# Patient Record
Sex: Female | Born: 1970 | Race: White | Hispanic: No | Marital: Married | Smoking: Former smoker
Health system: Southern US, Community
[De-identification: ages and names within clinical notes are randomized; demographics above are authoritative.]

## PROBLEM LIST (undated history)

## (undated) DIAGNOSIS — N6002 Solitary cyst of left breast: Secondary | ICD-10-CM

## (undated) DIAGNOSIS — F329 Major depressive disorder, single episode, unspecified: Secondary | ICD-10-CM

## (undated) DIAGNOSIS — D497 Neoplasm of unspecified behavior of endocrine glands and other parts of nervous system: Secondary | ICD-10-CM

## (undated) DIAGNOSIS — G4730 Sleep apnea, unspecified: Secondary | ICD-10-CM

## (undated) DIAGNOSIS — R51 Headache: Secondary | ICD-10-CM

## (undated) DIAGNOSIS — R519 Headache, unspecified: Secondary | ICD-10-CM

## (undated) DIAGNOSIS — E8881 Metabolic syndrome: Secondary | ICD-10-CM

## (undated) DIAGNOSIS — R32 Unspecified urinary incontinence: Secondary | ICD-10-CM

## (undated) DIAGNOSIS — E282 Polycystic ovarian syndrome: Secondary | ICD-10-CM

## (undated) DIAGNOSIS — E88819 Insulin resistance, unspecified: Secondary | ICD-10-CM

## (undated) DIAGNOSIS — R42 Dizziness and giddiness: Secondary | ICD-10-CM

## (undated) DIAGNOSIS — N809 Endometriosis, unspecified: Secondary | ICD-10-CM

## (undated) DIAGNOSIS — F32A Depression, unspecified: Secondary | ICD-10-CM

## (undated) DIAGNOSIS — F419 Anxiety disorder, unspecified: Secondary | ICD-10-CM

## (undated) DIAGNOSIS — Z9889 Other specified postprocedural states: Secondary | ICD-10-CM

## (undated) DIAGNOSIS — D259 Leiomyoma of uterus, unspecified: Secondary | ICD-10-CM

## (undated) DIAGNOSIS — R112 Nausea with vomiting, unspecified: Secondary | ICD-10-CM

## (undated) DIAGNOSIS — G629 Polyneuropathy, unspecified: Secondary | ICD-10-CM

## (undated) DIAGNOSIS — E785 Hyperlipidemia, unspecified: Secondary | ICD-10-CM

## (undated) DIAGNOSIS — T8859XA Other complications of anesthesia, initial encounter: Secondary | ICD-10-CM

## (undated) DIAGNOSIS — T4145XA Adverse effect of unspecified anesthetic, initial encounter: Secondary | ICD-10-CM

## (undated) DIAGNOSIS — D649 Anemia, unspecified: Secondary | ICD-10-CM

## (undated) DIAGNOSIS — M1990 Unspecified osteoarthritis, unspecified site: Secondary | ICD-10-CM

## (undated) DIAGNOSIS — T7840XA Allergy, unspecified, initial encounter: Secondary | ICD-10-CM

## (undated) DIAGNOSIS — K219 Gastro-esophageal reflux disease without esophagitis: Secondary | ICD-10-CM

## (undated) HISTORY — DX: Headache, unspecified: R51.9

## (undated) HISTORY — DX: Insulin resistance, unspecified: E88.819

## (undated) HISTORY — DX: Headache: R51

## (undated) HISTORY — DX: Neoplasm of unspecified behavior of endocrine glands and other parts of nervous system: D49.7

## (undated) HISTORY — DX: Leiomyoma of uterus, unspecified: D25.9

## (undated) HISTORY — DX: Depression, unspecified: F32.A

## (undated) HISTORY — DX: Major depressive disorder, single episode, unspecified: F32.9

## (undated) HISTORY — DX: Unspecified urinary incontinence: R32

## (undated) HISTORY — DX: Unspecified osteoarthritis, unspecified site: M19.90

## (undated) HISTORY — PX: OTHER SURGICAL HISTORY: SHX169

## (undated) HISTORY — DX: Hyperlipidemia, unspecified: E78.5

## (undated) HISTORY — DX: Allergy, unspecified, initial encounter: T78.40XA

## (undated) HISTORY — PX: PITUITARY EXCISION: SHX745

## (undated) HISTORY — DX: Polycystic ovarian syndrome: E28.2

## (undated) HISTORY — DX: Gastro-esophageal reflux disease without esophagitis: K21.9

## (undated) HISTORY — DX: Endometriosis, unspecified: N80.9

## (undated) HISTORY — DX: Metabolic syndrome: E88.81

---

## 1995-05-11 HISTORY — PX: TRANSPHENOIDAL / TRANSNASAL HYPOPHYSECTOMY / RESECTION PITUITARY TUMOR: SUR1382

## 1998-10-02 ENCOUNTER — Encounter (INDEPENDENT_AMBULATORY_CARE_PROVIDER_SITE_OTHER): Payer: Self-pay | Admitting: Specialist

## 1998-10-02 ENCOUNTER — Other Ambulatory Visit: Admission: RE | Admit: 1998-10-02 | Discharge: 1998-10-02 | Payer: Self-pay | Admitting: Gastroenterology

## 1999-03-27 ENCOUNTER — Other Ambulatory Visit: Admission: RE | Admit: 1999-03-27 | Discharge: 1999-03-27 | Payer: Self-pay | Admitting: Obstetrics and Gynecology

## 2000-02-17 ENCOUNTER — Other Ambulatory Visit: Admission: RE | Admit: 2000-02-17 | Discharge: 2000-02-17 | Payer: Self-pay | Admitting: Obstetrics and Gynecology

## 2000-03-12 HISTORY — PX: ANKLE RECONSTRUCTION: SHX1151

## 2000-06-02 ENCOUNTER — Ambulatory Visit (HOSPITAL_COMMUNITY): Admission: RE | Admit: 2000-06-02 | Discharge: 2000-06-02 | Payer: Self-pay | Admitting: Obstetrics and Gynecology

## 2000-06-02 ENCOUNTER — Encounter (INDEPENDENT_AMBULATORY_CARE_PROVIDER_SITE_OTHER): Payer: Self-pay

## 2004-02-13 ENCOUNTER — Inpatient Hospital Stay (HOSPITAL_COMMUNITY): Admission: AD | Admit: 2004-02-13 | Discharge: 2004-02-13 | Payer: Self-pay | Admitting: Gynecology

## 2004-02-29 ENCOUNTER — Ambulatory Visit (HOSPITAL_COMMUNITY): Admission: RE | Admit: 2004-02-29 | Discharge: 2004-02-29 | Payer: Self-pay | Admitting: Gynecology

## 2004-04-24 ENCOUNTER — Ambulatory Visit (HOSPITAL_COMMUNITY): Admission: RE | Admit: 2004-04-24 | Discharge: 2004-04-24 | Payer: Self-pay | Admitting: Gynecology

## 2004-06-06 ENCOUNTER — Ambulatory Visit (HOSPITAL_COMMUNITY): Admission: RE | Admit: 2004-06-06 | Discharge: 2004-06-06 | Payer: Self-pay | Admitting: Gynecology

## 2004-08-11 ENCOUNTER — Ambulatory Visit (HOSPITAL_COMMUNITY): Admission: AD | Admit: 2004-08-11 | Discharge: 2004-08-11 | Payer: Self-pay | Admitting: Gynecology

## 2004-08-12 ENCOUNTER — Inpatient Hospital Stay (HOSPITAL_COMMUNITY): Admission: AD | Admit: 2004-08-12 | Discharge: 2004-08-12 | Payer: Self-pay | Admitting: Gynecology

## 2004-08-24 ENCOUNTER — Inpatient Hospital Stay (HOSPITAL_COMMUNITY): Admission: AD | Admit: 2004-08-24 | Discharge: 2004-08-26 | Payer: Self-pay | Admitting: Gynecology

## 2004-08-27 ENCOUNTER — Encounter: Admission: RE | Admit: 2004-08-27 | Discharge: 2004-09-24 | Payer: Self-pay | Admitting: Gynecology

## 2005-07-10 HISTORY — PX: DILATION AND CURETTAGE OF UTERUS: SHX78

## 2005-08-30 ENCOUNTER — Emergency Department (HOSPITAL_COMMUNITY): Admission: EM | Admit: 2005-08-30 | Discharge: 2005-08-30 | Payer: Self-pay | Admitting: Emergency Medicine

## 2005-09-11 ENCOUNTER — Ambulatory Visit: Payer: Self-pay | Admitting: Gynecology

## 2005-09-25 ENCOUNTER — Ambulatory Visit (HOSPITAL_COMMUNITY): Admission: RE | Admit: 2005-09-25 | Discharge: 2005-09-25 | Payer: Self-pay | Admitting: Gynecology

## 2005-09-28 ENCOUNTER — Ambulatory Visit: Payer: Self-pay | Admitting: Gynecology

## 2005-10-16 ENCOUNTER — Emergency Department: Payer: Self-pay | Admitting: Emergency Medicine

## 2005-10-23 ENCOUNTER — Inpatient Hospital Stay (HOSPITAL_COMMUNITY): Admission: AD | Admit: 2005-10-23 | Discharge: 2005-10-23 | Payer: Self-pay | Admitting: Obstetrics & Gynecology

## 2005-10-26 ENCOUNTER — Ambulatory Visit: Payer: Self-pay | Admitting: Gynecology

## 2005-10-27 ENCOUNTER — Ambulatory Visit (HOSPITAL_COMMUNITY): Admission: AD | Admit: 2005-10-27 | Discharge: 2005-10-28 | Payer: Self-pay | Admitting: Obstetrics and Gynecology

## 2005-10-27 ENCOUNTER — Encounter (INDEPENDENT_AMBULATORY_CARE_PROVIDER_SITE_OTHER): Payer: Self-pay | Admitting: *Deleted

## 2005-10-27 ENCOUNTER — Ambulatory Visit: Payer: Self-pay | Admitting: Obstetrics and Gynecology

## 2005-11-16 ENCOUNTER — Ambulatory Visit: Payer: Self-pay | Admitting: Gynecology

## 2007-01-09 IMAGING — US US OB COMP +14 WK
1 series · 13 of 28 positions shown · non-contrast
Comparison: none

CLINICAL DATA: Anatomic exam.  History of prior from incompetent cervix at 18 weeks.  Cerclage in place.

[Series 1: us ob comp +14 wk · 0.33mm/px · 13 of 85 slices shown]
[im 4/85]
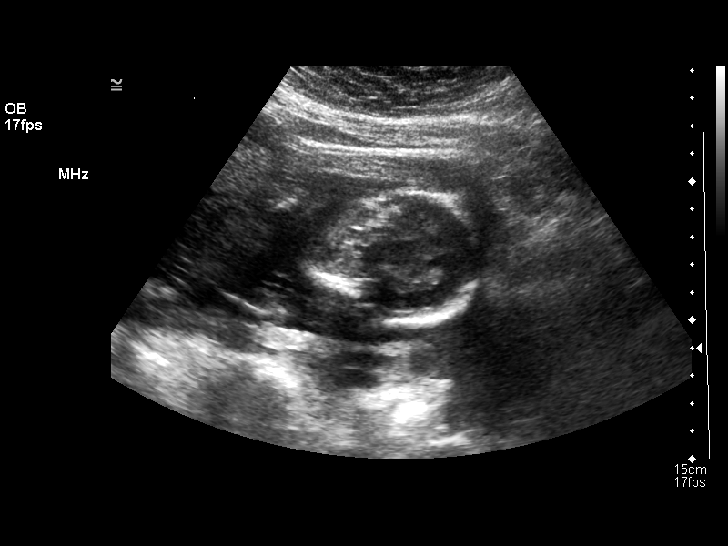
[im 10/85]
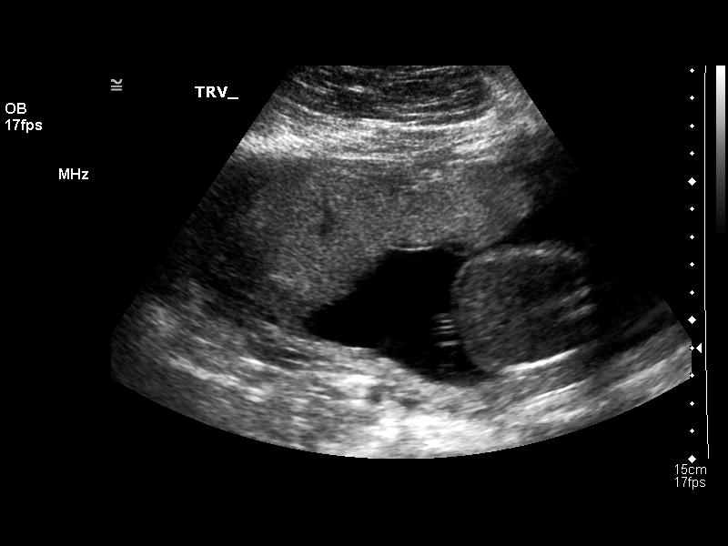
[im 16/85]
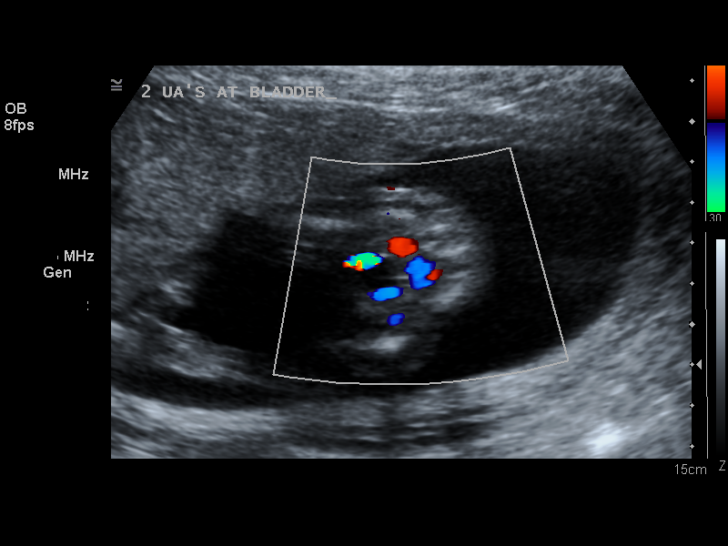
[im 22/85]
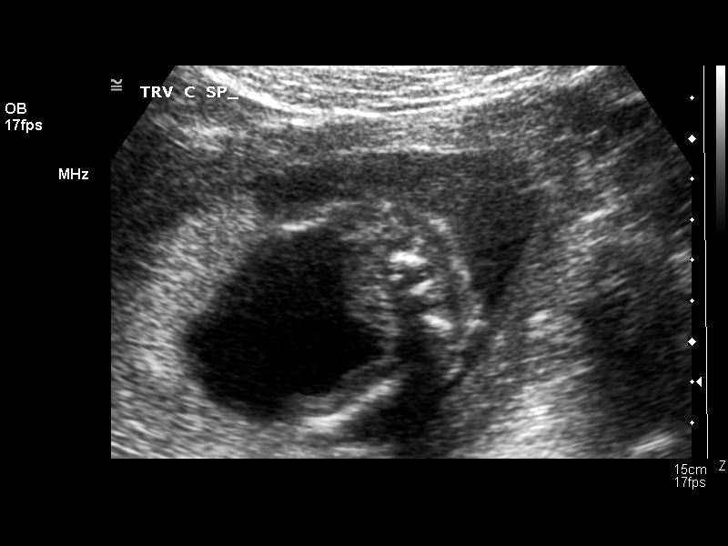
[im 29/85]
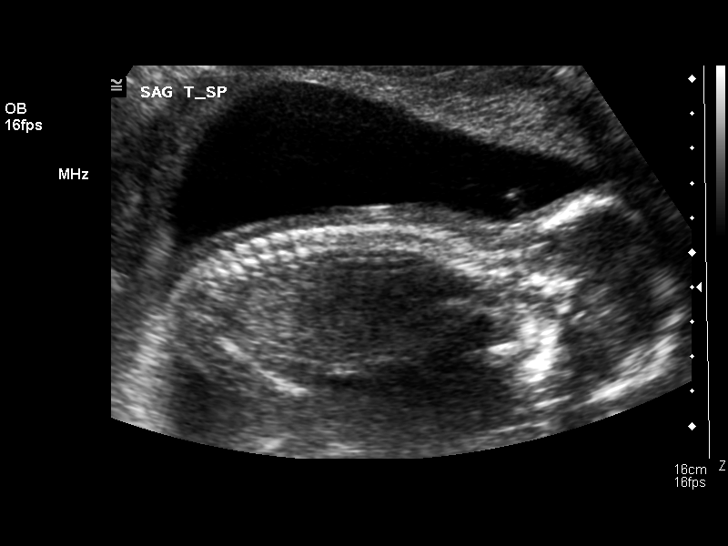
[im 35/85]
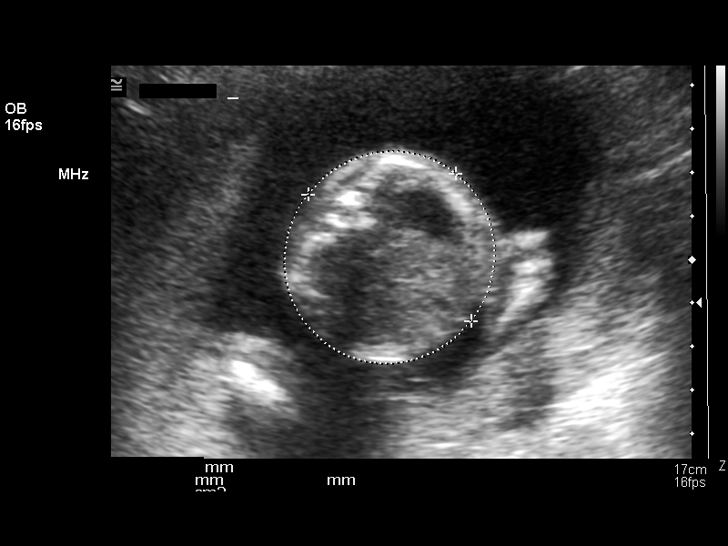
[im 44/85]
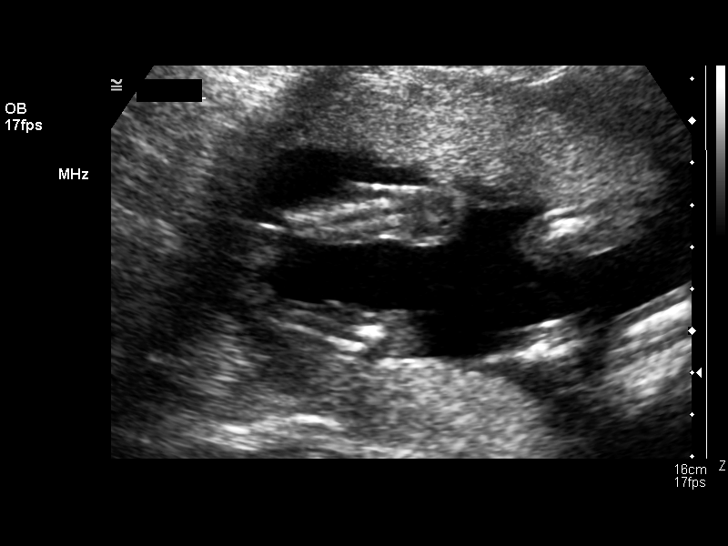
[im 50/85]
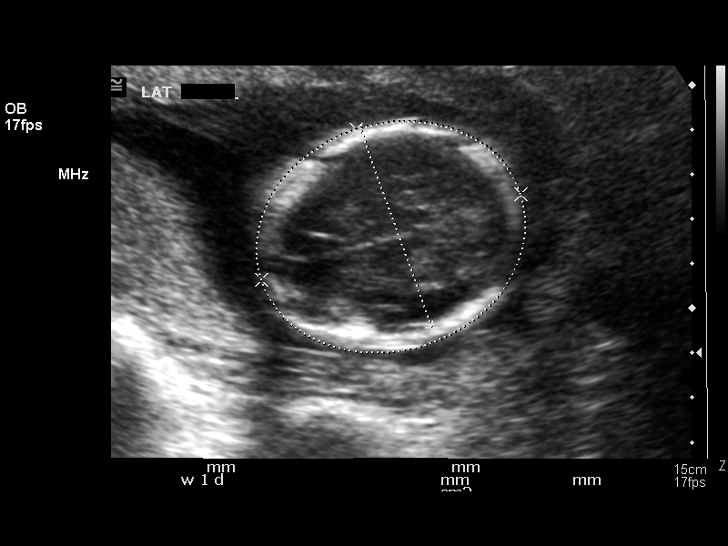
[im 57/85]
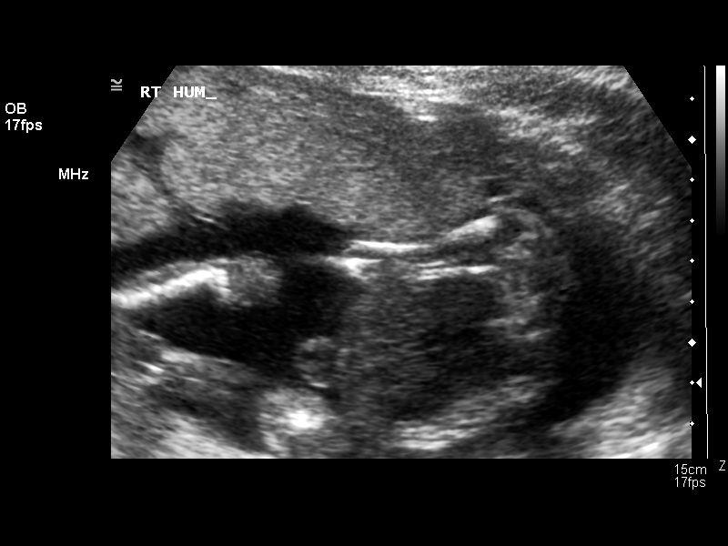
[im 63/85]
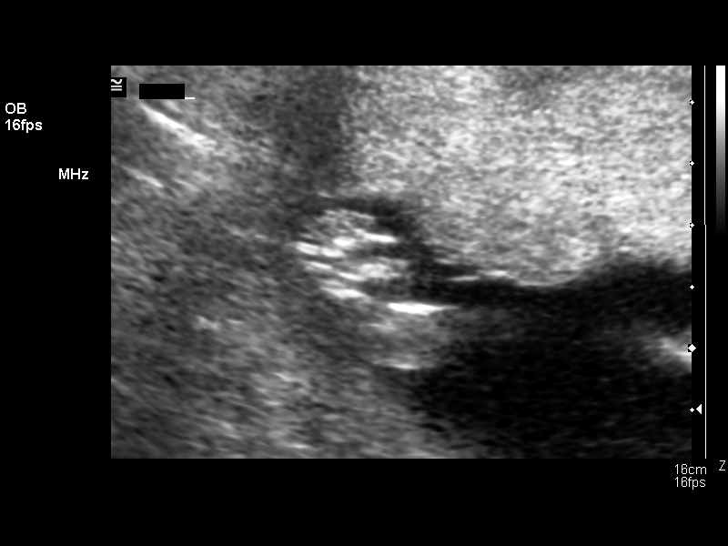
[im 69/85]
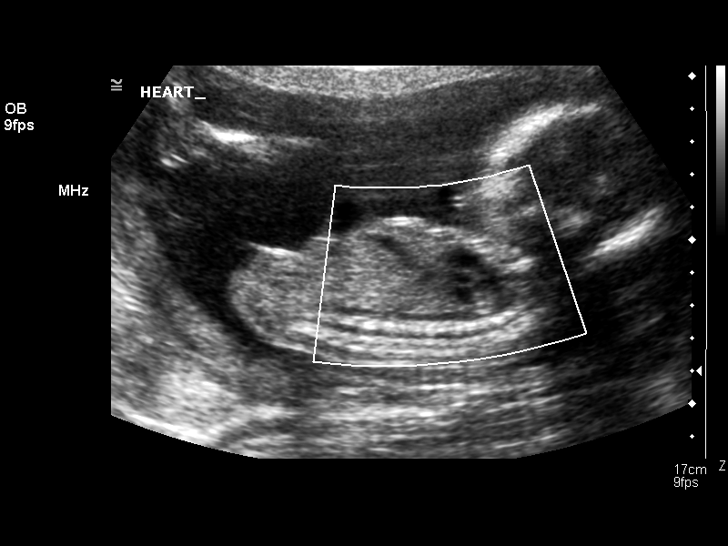
[im 75/85]
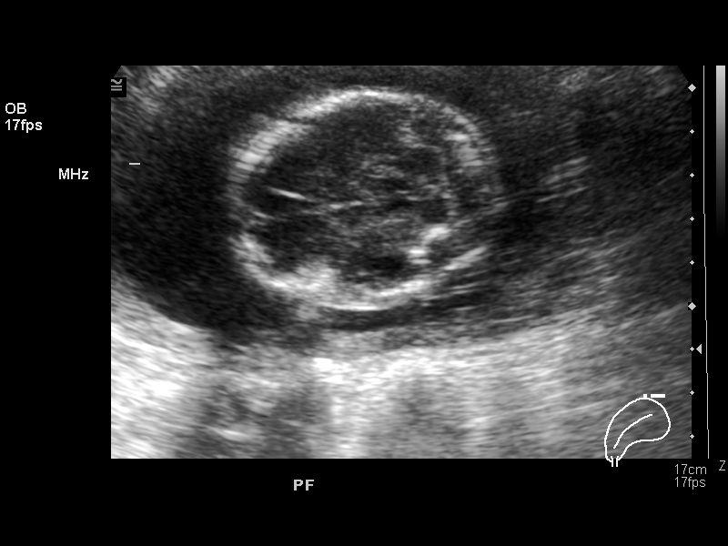
[im 81/85]
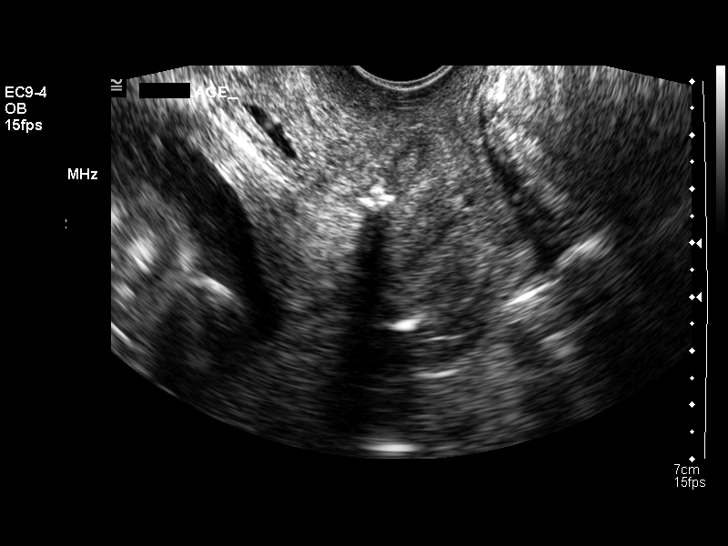

[13 of 28 positions shown; findings below may reference images not displayed]

OBSTETRICAL ULTRASOUND:
Overall scan resolution was diminished by maternal body habitus.  
Number of Fetuses:  1
Heart Rate:  157
Movement:  Yes
Breathing:  No  
Presentation:  Cephalic
Placental Location:  Anterior
Grade:  I
Previa:  No
Amniotic Fluid (Subjective):  Normal
Amniotic Fluid (Objective):   4.9 cm Vertical pocket

FETAL BIOMETRY
BPD:  4.7 cm   20 w 1 d
HC:  17.5 cm  20 w 0 d
AC:  15.0 cm   20 w 2 d
FL:    3.3 cm   20 w 2 d

MEAN GA:  20 w 1 d

FETAL ANATOMY
Lateral Ventricles:    Visualized 
Thalami/CSP:      Visualized 
Posterior Fossa:  Visualized 
Nuchal Region:    Visualized 
Spine:      Visualized 
4 Chamber Heart on Left:      Visualized 
Stomach on Left:      Visualized 
3 Vessel Cord:    Visualized 
Cord Insertion site:    Visualized 
Kidneys:  Visualized 
Bladder:  Visualized 
Extremities:      Visualized 

ADDITIONAL ANATOMY VISUALIZED:  LVOT, RVOT, upper lip, orbits, profile, diaphragm, heel, 5th digit and female genitalia.

MATERNAL UTERINE AND ADNEXAL FINDINGS
Cervix:   4.2 cm Transvaginally
IMPRESSION: 1.  Single intrauterine pregnancy demonstrating an estimated gestational age by ultrasound of 20 weeks and 1 day.  Correlation with assigned gestational age by initial ultrasound of 19 weeks and 2 days suggests appropriate growth.  
2.  No focal fetal or placental abnormalities are noted.  All of the fetal anatomy could be seen with the exception of the arches which could not be seen due to positioning.  
3.  No cervical length.  The patient?s cerclage was visualized in place.

</u12:p>

## 2007-07-20 ENCOUNTER — Ambulatory Visit: Payer: Self-pay | Admitting: Otolaryngology

## 2007-07-26 ENCOUNTER — Ambulatory Visit: Payer: Self-pay | Admitting: Family Medicine

## 2007-11-02 ENCOUNTER — Ambulatory Visit: Payer: Self-pay | Admitting: Internal Medicine

## 2008-05-31 ENCOUNTER — Ambulatory Visit: Payer: Self-pay | Admitting: Internal Medicine

## 2010-03-07 ENCOUNTER — Observation Stay: Payer: Self-pay | Admitting: Specialist

## 2010-06-27 NOTE — H&P (Signed)
[facility]  Patient:    Paula Oliver, Paula Oliver                       MRN: [id number] Adm. Date:  [id number] Attending:  Lenoard Aden CC:         [facility]   History and Physical  CHIEF COMPLAINT:              Dyspareunia, dysmenorrhea, and dysfunctional uterine bleeding.  HISTORY OF PRESENT ILLNESS:   This is a 40 year old white female, gravida 0, para 0, with known irregular uterine bleeding, and workup to include questionable structural lesion on saline sonohistography, and persistent dyspareunia, and dysmenorrhea.  Workup is normal to include an otherwise normal ultrasound, and a normal laboratory workup to include normal TSH and prolactin.  PAST MEDICAL HISTORY:         Remarkable for no medical or surgical hospitalizations except for removal of a benign pituitary tumor in 1999.  ALLERGIES:                    NO KNOWN DRUG ALLERGIES.  MEDICATIONS:                  Takes no medications.  FAMILY HISTORY:               Diabetes and hypertension.  SOCIAL HISTORY:               A smoker of a third of a pack a day.  Denies domestic or physical violence.  PREGNANCY HISTORY:            Noncontributory.  PHYSICAL EXAMINATION:  GENERAL:                      An obese white female in no apparent distress.  HEENT:                        Normal.  LUNGS:                        Clear.  HEART:                        Regular rhythm.  ABDOMEN:                      Soft, obese, and nontender.  PELVIC EXAMINATION:           Reveals a retroflexed uterus.  No adnexal masses.  Diffuse adnexal tenderness, specifically on the right which is greater than the left.  No palpable masses as noted.  EXTREMITIES:                  Revealed no cords.  NEUROLOGIC EXAMINATION:       Nonfocal.  IMPRESSION:                   1. Dysfunctional uterine bleeding with                                  questionable structural lesion.                               2.  Worsening secondary dyspareunia and  dysmenorrhea.  PLAN:                         Proceed with diagnostic laparoscopy, diagnostic hysteroscopy, resectoscope, and D&C.  Risks of anesthesia, infection, bleeding, injury to abdominal organs, and need for repair is discussed. Uterine perforation, delayed versus immediate complications to include bowel and bladder injury were also discussed.  The patient acknowledges and desires to proceed.  She is apprised of the fact that the inability to cure pain as a result of surgery is a possibility.  She will proceed with diagnostic laparoscopy and hysteroscopy as noted. DD:  06/02/00 TD:  06/02/00 Job: [id number]

## 2010-06-27 NOTE — Op Note (Signed)
[facility]  Patient:    Paula Oliver, Paula Oliver                       MRN: [id number] Proc. Date: 06/02/00 Adm. Date:  [id number] Attending:  Lenoard Aden CC:         [facility]   Operative Report  PREOPERATIVE DIAGNOSES:       1. Dysfunctional uterine bleeding with                                  structural lesion on saline                                  sonohysterography.                               2. Secondary dysmenorrhea.                               3. Secondary dyspareunia.  POSTOPERATIVE DIAGNOSES:      1. Endometrial polyp.                               2. Pelvic endometriosis.  PROCEDURES:                   1. Diagnostic hysteroscopy.                               2. Resectoscopic polypectomy.                               3. Dilation and curettage.                               4. Diagnostic laparoscopy.                               5. Left ovarian cystectomy.                               6. Fulguration of left and right ovarian                                  endometriosis.                               7. Fulguration of cul-de-sac endometriosis.  SURGEON:                      Lenoard Aden, M.D.  ANESTHESIA:                   General.  ESTIMATED BLOOD LOSS:         Less than 50 cc.  FLUID DEFICIT:  30 cc.  COUNTS:                       Correct.  DISPOSITION:                  Patient to recovery in good condition.  DESCRIPTION OF PROCEDURE:     After being apprised of the risks of anesthesia, infection, bleeding, injury to abdominal organs with the need for repair, and the inability to cure pain, the patient was brought to the operating room, where she was administered general anesthesia without complications, prepped and draped in the usual sterile fashion.  The feet were placed in the elephant stirrups.  Examination under anesthesia revealed a retroflexed uterus, no adnexal masses and no  cervical lesions.  At this time, a weighted speculum was placed and dilute Pitressin solution was infiltrated at 3 and 9 oclock at the cervicovaginal junction, 15 cc of the solution.  The uterus easily dilated up to a #31 Pratt dilator.  The diagnostic hysteroscope was placed. Visualization revealed an anterior wall endometrial polyp, which was resected using the double-angled loop, normal tubal ostia and a normal cervical canal. D&C was performed and endometrial curettings were sent for analysis. Revisualization revealed a normal endometrial cavity.  The instruments were removed.  Fluid deficit of 30 cc was noted.   A Hulka tenaculum was placed. Attention was then turned to the abdominal portion of the procedure, whereby an infraumbilical incision was made with a scalpel after placement of a Marcaine solution.  A Veress needle was placed.  An opening pressure of 2 was noted.  CO2 was insufflated.  High pressure was noted.  Hanging drop test was negative, however.  Preperitoneal entry was noted on the first trocar placement.  The trocar was removed.  A long Veress needle was used.  An opening pressure of -2 was noted.  Patient pressure was set to 25 for insufflation of CO2.  After establishing trocar entry, patient pressure was reset to 15.  Then, 3.5 L of CO2 was able to be insufflated without difficulty.  A trocar was placed atraumatically.  Intraperitoneal entry was noted.  Visualization revealed atraumatic trocar entry, normal liver and gallbladder bed, normal appendix, uterus with small anterior subserosal fibroid in the fundal area and one small fibroid posteriorly, left ovary with superficial endometriosis and a left suspicious endometriotic cyst, right ovary with superficial endometrial implants noted as well as clear vesicular lesions.  The posterior cul-de-sac on the left beneath the left uterosacral revealed a Masters window and endometriosis.  The left ovarian  superficial implants were cauterized using bipolar cautery after atraumatic placement of two 5 mm trocars suprapubically after placement of a dilute Marcaine solution. Bipolar cautery was used to excise these.  The ovarian cyst was entered and the contents extracted and sent to pathology for confirmation of endometriosis.  The cyst bed was removed.  Any residual areas were cauterized. At this time, the right ovarian endometriosis was cauterized as well as superficial implants on all surfaces noted.  No evidence of endometriosis in the bilateral ovarian fossae.  At this time, the ureter was identified on the left side.  The left Masters window was elevated and extracted towards the midline.  This entire Masters window was excised and cauterized using bipolar cautery.  Endometriosis was visualized in the lower-most portion of the sac, but this was excised completely.  Good hemostasis was noted.  All instruments were then removed under direct visualization.  CO2  was released.  The incisions were closed using 0 Vicryl, 4-0 Vicryl and Dermabond.  The instruments were removed from the vagina.  The patient tolerated the procedure well and was transferred to recovery in good condition. DD:  06/02/00 TD:  06/02/00 Job: [id number]

## 2010-06-27 NOTE — Op Note (Signed)
NAME:  Paula Oliver, Paula Oliver NO.:  [id number]   MEDICAL RECORD NO.:  [id number]          PATIENT TYPE:  AMB   LOCATION:  SDC                           FACILITY:  [facility]   PHYSICIAN:  Ginger Carne, MD  DATE OF BIRTH:  07-16-1970   DATE OF PROCEDURE:  02/29/2004  DATE OF DISCHARGE:                                 OPERATIVE REPORT   PREOPERATIVE DIAGNOSES:  1.  Cervical incompetence.  2.  Twelve weeks' gestation.   POSTOPERATIVE DIAGNOSES:  1.  Cervical incompetence.  2.  Twelve weeks' gestation.   PROCEDURE:  Cervical Shirodkar cerclage.   SURGEON:  Ginger Carne, MD   ASSISTANT:  None.   COMPLICATIONS:  None immediate.   ESTIMATED BLOOD LOSS:  Minimal.   SPECIMENS:  None.   ANESTHESIA:  Spinal.   OPERATIVE FINDINGS:  Cervix admitted a #8 Hegar dilator.  The patient is [redacted]  weeks pregnant with a positive fetal heart tone prior to surgery.  The  Mersilene suture was placed approximately 2-3 cm from the ectocervix.   OPERATIVE PROCEDURE:  Patient prepped and draped in the usual fashion and  placed in lithotomy position.  Betadine solution used for antiseptic and the  patient was catheterized prior to the procedure.  Marcaine with epinephrine  was injected circumferentially around the cervix.  Two centimeters of  anterior and posterior vaginal epithelium were incised transversely at the  proper fascial plane.  A 5 mm Mersilene strap was placed around the cervix  so that the knot was anterior, and four tied knots were placed as a square  knot pattern.  Afterwards, bleeding points hemostatically checked, copious  irrigation with lactated Ringer's throughout the procedure, and closure of  anterior and posterior vaginal epithelial walls with 3-0 Vicryl running  interlocking suture.  The cervix was closed at the end of the procedure.  The patient tolerated the procedure well, returned to the postanesthesia  recovery room in excellent condition.                                       ______________________________  Ginger Carne, MD  Electronically Signed 03/03/2004 10:19:52 EST    SHB/MEDQ  D:  02/29/2004  T:  02/29/2004  Job:  [id number]

## 2010-06-27 NOTE — Op Note (Signed)
NAME:  JOHNATHON, MITTAL NO.:  [id number]   MEDICAL RECORD NO.:  [id number]          PATIENT TYPE:  AMB   LOCATION:                                FACILITY:  [facility]   PHYSICIAN:  Phil D. Okey Dupre, M.D.     DATE OF BIRTH:  Nov 29, 1970   DATE OF PROCEDURE:  10/28/2005  DATE OF DISCHARGE:                                 OPERATIVE REPORT   PROCEDURE:  Dilatation evacuation.   PREOPERATIVE DIAGNOSIS:  Incomplete abortion.   POSTOPERATIVE DIAGNOSIS:  Incomplete abortion.   SURGEON:  Dr. Okey Dupre.   ESTIMATED BLOOD LOSS:  20 cc.   POSTOPERATIVE CONDITION:  Satisfactory.   ANESTHESIA:  Local.   Products of conception sent to pathology.   PROCEDURE WENT AS FOLLOWS:  With the patient in the dorsal lithotomy  position, perineum and vagina were prepped and draped in usual sterile  manner. Bimanual pelvic examination revealed the uterus to be about 7-weeks'  gestational size, freely movable and normal free adnexa. Weighted speculum  was placed in the posterior fourchette of the vagina. Anterior lip of the  cervix grasped with the ring forceps. Uterine cavity sounded to a depth of 9  cm. Ten cc of 1% Xylocaine was placed as a paracervical block at 4 and 8  o'clock paracervical area using 10 cc in each area. The uterine cavity was  then evacuated with a #10 suction curet without incident. The ring forceps  _____________ patient transferred to the recovery room in satisfactory  condition, having tolerated the procedure well.           ______________________________  Javier Glazier. Okey Dupre, M.D.     PDR/MEDQ  D:  10/28/2005  T:  10/29/2005  Job:  [id number]

## 2010-06-27 NOTE — Op Note (Signed)
NAME:  Paula Oliver, Paula Oliver NO.:  [id number]   MEDICAL RECORD NO.:  [id number]          PATIENT TYPE:  AMB   LOCATION:  SDC                           [facility]ACILITY:  [facility]   PHYSICIAN:  Ginger Carne, MD  DATE OF BIRTH:  11-28-1970   DATE OF PROCEDURE:  08/11/2004  DATE OF DISCHARGE:                                 OPERATIVE REPORT   PREOPERATIVE DIAGNOSIS:  Cervical incompetence, removal of cervical cerclage  at [redacted] weeks gestation.   POSTOPERATIVE DIAGNOSIS:  Cervical incompetence, removal of cervical  cerclage at [redacted] weeks gestation.   OPERATION/PROCEDURE:  Removal of cervical Shirodkar cerclage.   SURGEON:  Ginger Carne, M.D.   ASSISTANT:  None.   COMPLICATIONS:  None.   ESTIMATED BLOOD LOSS:  Minimal.   ANESTHESIA:  MAC.   SPECIMENS:  None.   OPERATIVE FINDINGS:  An anterior knot was present. Cervix effaced to 80% and  admitting one finger.  The suture was removed in its entirety.   DESCRIPTION OF PROCEDURE:  The patient prepped and draped in the usual  fashion and placed in the lithotomy position. Betadine solution used for  antiseptic and the patient was not catheterized.  After adequate MAC  analgesia, a speculum was placed in the vaginal canal.  The knot was  identified, lifted, and the strap cut and removed in toto.  Minimal bleeding  noted.  The patient tolerated the procedure well, returned to post  anesthesia recovery room in excellent condition.       SHB/MEDQ  D:  08/11/2004  T:  08/11/2004  Job:  [id number]

## 2012-07-12 ENCOUNTER — Emergency Department: Payer: Self-pay | Admitting: Internal Medicine

## 2012-07-12 LAB — CBC
HCT: 36.7 % (ref 35.0–47.0)
HGB: 12.4 g/dL (ref 12.0–16.0)
MCHC: 33.8 g/dL (ref 32.0–36.0)
RDW: 15.4 % — ABNORMAL HIGH (ref 11.5–14.5)
WBC: 6.9 10*3/uL (ref 3.6–11.0)

## 2012-07-12 LAB — BASIC METABOLIC PANEL
Chloride: 104 mmol/L (ref 98–107)
Co2: 28 mmol/L (ref 21–32)
EGFR (African American): >60
EGFR (Non-African Amer.): >60
Glucose: 95 mg/dL (ref 65–99)

## 2012-07-12 LAB — URINALYSIS, COMPLETE
Bilirubin,UR: NEGATIVE
Blood: NEGATIVE
Glucose,UR: NEGATIVE mg/dL (ref 0–75)
Ketone: NEGATIVE
Ph: 5 (ref 4.5–8.0)
Protein: NEGATIVE
RBC,UR: <1 /HPF (ref 0–5)
WBC UR: <1 /HPF (ref 0–5)

## 2013-11-28 ENCOUNTER — Encounter: Payer: Self-pay | Admitting: Family Medicine

## 2013-11-28 ENCOUNTER — Encounter (INDEPENDENT_AMBULATORY_CARE_PROVIDER_SITE_OTHER): Payer: Self-pay

## 2013-11-28 ENCOUNTER — Ambulatory Visit (INDEPENDENT_AMBULATORY_CARE_PROVIDER_SITE_OTHER): Payer: Managed Care, Other (non HMO) | Admitting: Family Medicine

## 2013-11-28 ENCOUNTER — Other Ambulatory Visit (HOSPITAL_COMMUNITY)
Admission: RE | Admit: 2013-11-28 | Discharge: 2013-11-28 | Disposition: A | Discharge status: Home / Self Care | Payer: Managed Care, Other (non HMO) | Source: Ambulatory Visit | Attending: Family Medicine | Admitting: Family Medicine

## 2013-11-28 VITALS — BP 142/92 | HR 82 | Temp 98.2°F | Ht 65.24 in | Wt 279.5 lb

## 2013-11-28 DIAGNOSIS — E282 Polycystic ovarian syndrome: Secondary | ICD-10-CM | POA: Insufficient documentation

## 2013-11-28 DIAGNOSIS — Z8639 Personal history of other endocrine, nutritional and metabolic disease: Secondary | ICD-10-CM

## 2013-11-28 DIAGNOSIS — Z1151 Encounter for screening for human papillomavirus (HPV): Secondary | ICD-10-CM | POA: Diagnosis present

## 2013-11-28 DIAGNOSIS — N921 Excessive and frequent menstruation with irregular cycle: Secondary | ICD-10-CM

## 2013-11-28 DIAGNOSIS — E785 Hyperlipidemia, unspecified: Secondary | ICD-10-CM | POA: Insufficient documentation

## 2013-11-28 DIAGNOSIS — N920 Excessive and frequent menstruation with regular cycle: Secondary | ICD-10-CM | POA: Insufficient documentation

## 2013-11-28 DIAGNOSIS — Z01419 Encounter for gynecological examination (general) (routine) without abnormal findings: Secondary | ICD-10-CM | POA: Diagnosis present

## 2013-11-28 DIAGNOSIS — N6452 Nipple discharge: Secondary | ICD-10-CM

## 2013-11-28 DIAGNOSIS — G629 Polyneuropathy, unspecified: Secondary | ICD-10-CM | POA: Insufficient documentation

## 2013-11-28 DIAGNOSIS — Z87898 Personal history of other specified conditions: Secondary | ICD-10-CM | POA: Insufficient documentation

## 2013-11-28 DIAGNOSIS — Z0000 Encounter for general adult medical examination without abnormal findings: Secondary | ICD-10-CM | POA: Insufficient documentation

## 2013-11-28 MED ORDER — METFORMIN HCL ER 500 MG PO TB24: 500.0000 mg | ORAL_TABLET | Freq: Every day | ORAL | Status: DC

## 2013-11-28 NOTE — Assessment & Plan Note (Signed)
Has not been taking Metformin. Per her labs drawn at work, a1c normal.  Will recheck here.  Restart low dose metformin for insulin resistance. Marland Kitchenagere

## 2013-11-28 NOTE — Assessment & Plan Note (Signed)
MRI ordered. Will also check prolactin level.

## 2013-11-28 NOTE — Addendum Note (Signed)
Addended by: Lucille Passy on: 11/28/2013 03:37 PM   Modules accepted: Orders

## 2013-11-28 NOTE — Progress Notes (Signed)
Subjective:    Patient ID: Paula Oliver, female    DOB: 20-Sep-1970, 43 y.o.   MRN: [id number]  HPI  Very pleasant 43 yo G3P1 with h/o PCOS, HLD here to establish care.  Has not been to a doctor in over 3 years. Last pap smear was in 2012.  Last abnormal pap smear in 1999.  She works for [employer]- just had a1c done in August and it was 5.6. She has noticed tingling/burning in her feet.  Started taking OTC B complex and it improved a little bit.  H/o pituitary tumor- surgically removed in 05/1995. She has noticed recently right yellowish/milky nipple discharge and pain which she felt when her pituitary tumor was diagnosed.  No HA or blurred vision.  Has not had a mammogram since she turned 43 yo.  Periods have been heavy and irregular.  H/o HLD.  Has not been taking rx for this.  Obesity- has tried "every diet." Easier time losing weight when she was taking Metformin.  She would like to restart this.  No current outpatient prescriptions on file prior to visit.   No current facility-administered medications on file prior to visit.    Allergies  Allergen Reactions  . Azithromycin Other (See Comments)    Stomach cramping    Past Medical History  Diagnosis Date  . Depression   . GERD (gastroesophageal reflux disease)   . Allergy   . Hyperlipidemia   . Headache   . Urinary incontinence   . PCOS (polycystic ovarian syndrome)   . Insulin resistance   . Pituitary tumor   . Endometriosis   . Uterine fibroid     Past Surgical History  Procedure Laterality Date  . Ankle reconstruction  2002  . Transphenoidal / transnasal hypophysectomy / resection pituitary tumor      Family History  Problem Relation Age of Onset  . Arthritis Mother   . Hyperlipidemia Mother   . Hypertension Mother   . Alcohol abuse Father   . Drug abuse Father   . Arthritis Father   . Hyperlipidemia Father   . Hypertension Father   . Mental illness Father   . Mental illness Brother   .  Arthritis Maternal Grandmother   . Hyperlipidemia Maternal Grandmother   . Hypertension Maternal Grandmother   . Diabetes Maternal Grandmother   . Arthritis Maternal Grandfather   . Hyperlipidemia Maternal Grandfather     History   Social History  . Marital Status: Married    Spouse Name: N/A    Number of Children: N/A  . Years of Education: N/A   Occupational History  . Not on file.   Social History Main Topics  . Smoking status: Former Research scientist (life sciences)  . Smokeless tobacco: Never Used  . Alcohol Use: No  . Drug Use: No  . Sexual Activity: No   Other Topics Concern  . Not on file   Social History Narrative  . No narrative on file   The PMH, PSH, Social History, Family History, Medications, and allergies have been reviewed in [facility], and have been updated if relevant.   Review of Systems  Constitutional: Positive for unexpected weight change. Negative for activity change and appetite change.  HENT: Negative for trouble swallowing.   Eyes: Positive for visual disturbance. Negative for photophobia, pain and redness.  Respiratory: Negative for chest tightness and shortness of breath.   Cardiovascular: Negative for chest pain, palpitations and leg swelling.  Gastrointestinal: Negative for nausea, vomiting, diarrhea and constipation.  Endocrine: Negative for polydipsia, polyphagia and polyuria.  Genitourinary: Positive for vaginal bleeding and menstrual problem. Negative for dysuria, vaginal discharge and vaginal pain.  Musculoskeletal: Negative.   Skin: Negative.   Neurological: Positive for dizziness and numbness. Negative for tremors, seizures, syncope, speech difficulty and weakness.  Hematological: Negative.   Psychiatric/Behavioral: Negative.   All other systems reviewed and are negative.  See HPI    Objective:   Physical Exam  Nursing note and vitals reviewed. Constitutional: She is oriented to person, place, and time. She appears well-developed.  Obese, hirsutism  present  HENT:  Head: Normocephalic and atraumatic.  Eyes: Conjunctivae and EOM are normal. Pupils are equal, round, and reactive to light.  Neck: Normal range of motion. Neck supple. No thyromegaly present.  Cardiovascular: Normal rate, regular rhythm and normal heart sounds.   Pulmonary/Chest: Effort normal and breath sounds normal.  Abdominal: Soft. Bowel sounds are normal. She exhibits no distension. There is no tenderness.  Genitourinary: Rectum normal, vagina normal and uterus normal. No breast swelling, tenderness, discharge or bleeding. There is no rash, tenderness or lesion on the right labia. There is no rash, tenderness or lesion on the left labia. Cervix exhibits no motion tenderness, no discharge and no friability. Right adnexum displays no mass, no tenderness and no fullness. Left adnexum displays no mass, no tenderness and no fullness.  Lymphadenopathy:       Right: No inguinal adenopathy present.       Left: No inguinal adenopathy present.  Neurological: She is alert and oriented to person, place, and time. She has normal reflexes. No cranial nerve deficit. Coordination normal.  Skin: Skin is warm and dry.  Psychiatric: She has a normal mood and affect. Her behavior is normal. Judgment and thought content normal.   BP 142/92  Pulse 82  Temp(Src) 98.2 F (36.8 C) (Oral)  Ht 5' 5.24" (1.657 m)  Wt 279 lb 8 oz (126.78 kg)  BMI 46.17 kg/m2  SpO2 98%  LMP 10/29/2013        Assessment & Plan:

## 2013-11-28 NOTE — Assessment & Plan Note (Signed)
Pap smear done today. Screening labs as well. Mammogram ordered- see below.

## 2013-11-28 NOTE — Progress Notes (Signed)
Pre visit review using our clinic review tool, if applicable. No additional management support is needed unless otherwise documented below in the visit note. 

## 2013-11-28 NOTE — Assessment & Plan Note (Signed)
New- unable to express discharge on exam. Will check prolactin level and diagnostic mammogram.

## 2013-11-28 NOTE — Assessment & Plan Note (Signed)
Recheck labs today. 

## 2013-11-28 NOTE — Assessment & Plan Note (Signed)
New- improving. Discussed with pt that causes of neuropathy are varied. Certainly could be a b12 deficiency, elevated glucose, thyroid dysfunction, etc. Will check labs today as initial step in work up. The patient indicates understanding of these issues and agrees with the plan.

## 2013-11-28 NOTE — Patient Instructions (Signed)
It was nice to meet you. We will call you with your lab results.  Please stop by to see Rosaria Ferries on your way out.

## 2013-11-29 LAB — COMPREHENSIVE METABOLIC PANEL
ALBUMIN: 4.1 g/dL (ref 3.5–5.5)
ALT: 23 IU/L (ref 0–32)
AST: 23 IU/L (ref 0–40)
Albumin/Globulin Ratio: 1.5 (ref 1.1–2.5)
Alkaline Phosphatase: 90 IU/L (ref 39–117)
BUN/Creatinine Ratio: 15 (ref 9–23)
BUN: 10 mg/dL (ref 6–24)
CO2: 24 mmol/L (ref 18–29)
Calcium: 9.1 mg/dL (ref 8.7–10.2)
Chloride: 99 mmol/L (ref 97–108)
Creatinine, Ser: 0.67 mg/dL (ref 0.57–1.00)
GFR calc non Af Amer: 108 mL/min/{1.73_m2} (ref >59)
GFR, EST AFRICAN AMERICAN: 125 mL/min/{1.73_m2} (ref >59)
GLOBULIN, TOTAL: 2.7 g/dL (ref 1.5–4.5)
Glucose: 81 mg/dL (ref 65–99)
Potassium: 4 mmol/L (ref 3.5–5.2)
Sodium: 138 mmol/L (ref 134–144)
Total Protein: 6.8 g/dL (ref 6.0–8.5)

## 2013-11-29 LAB — LIPID PANEL
CHOLESTEROL TOTAL: 260 mg/dL — AB (ref 100–199)
Chol/HDL Ratio: 7 ratio units — ABNORMAL HIGH (ref 0.0–4.4)
HDL: 37 mg/dL — AB (ref >39)
Triglycerides: 535 mg/dL — ABNORMAL HIGH (ref 0–149)

## 2013-11-29 LAB — T4, FREE: Free T4: 1.12 ng/dL (ref 0.82–1.77)

## 2013-11-29 LAB — HEMOGLOBIN A1C
Est. average glucose Bld gHb Est-mCnc: 114 mg/dL
Hgb A1c MFr Bld: 5.6 % (ref 4.8–5.6)

## 2013-11-29 LAB — PROLACTIN: PROLACTIN: 4.5 ng/mL — AB (ref 4.8–23.3)

## 2013-11-29 LAB — TSH: TSH: 1.19 u[IU]/mL (ref 0.450–4.500)

## 2013-11-29 LAB — VITAMIN B12: Vitamin B-12: 581 pg/mL (ref 211–946)

## 2013-11-30 ENCOUNTER — Encounter: Payer: Self-pay | Admitting: *Deleted

## 2013-11-30 LAB — CYTOLOGY - PAP

## 2013-12-08 ENCOUNTER — Ambulatory Visit: Payer: Self-pay | Admitting: Family Medicine

## 2013-12-11 ENCOUNTER — Encounter: Payer: Self-pay | Admitting: Family Medicine

## 2013-12-11 ENCOUNTER — Other Ambulatory Visit (INDEPENDENT_AMBULATORY_CARE_PROVIDER_SITE_OTHER): Payer: Managed Care, Other (non HMO)

## 2013-12-11 DIAGNOSIS — E785 Hyperlipidemia, unspecified: Secondary | ICD-10-CM

## 2013-12-11 LAB — LIPID PANEL
Cholesterol: 256 mg/dL — ABNORMAL HIGH (ref 0–200)
HDL: 34.3 mg/dL — AB (ref >39.00)
NONHDL: 221.7
Total CHOL/HDL Ratio: 7
Triglycerides: 490 mg/dL — ABNORMAL HIGH (ref 0.0–149.0)
VLDL: 98 mg/dL — AB (ref 0.0–40.0)

## 2013-12-11 LAB — LDL CHOLESTEROL, DIRECT: LDL DIRECT: 141.2 mg/dL

## 2013-12-13 ENCOUNTER — Telehealth: Payer: Self-pay | Admitting: *Deleted

## 2013-12-13 NOTE — Telephone Encounter (Signed)
Noted.  I would advise against an MRI at this point.  Prolactin was not elevated and not currently having discharge.  I would proceed with ductogram next if discharge returns.

## 2013-12-13 NOTE — Telephone Encounter (Signed)
Lm on pts vm requesting a call back. Paula Oliver has cancelled scheduled MRI on 11/10

## 2013-12-13 NOTE — Telephone Encounter (Signed)
See 10/20 lab results

## 2013-12-13 NOTE — Telephone Encounter (Signed)
Lm on pts vm requesting a call back 

## 2013-12-13 NOTE — Telephone Encounter (Signed)
Did she already have the ductogram done too?

## 2013-12-13 NOTE — Telephone Encounter (Signed)
Pt left VM stating that [employer] had denied her MRI until she had her mammogram and now pt had her mammogram and she would like to move forward with the MRI. Please advise if this can be done.  ( I don't see anything in her chart about a MRI)

## 2013-12-13 NOTE — Telephone Encounter (Signed)
Patient returned Paula Oliver's call and I informed patient of Dr. Hulen Shouts comments. Patient asked if MRI had been cancelled, I informed her that [facility] did cancel that appt. Patient verbalized understanding.

## 2013-12-13 NOTE — Telephone Encounter (Signed)
Paula Oliver pulled report and pt has not had ductogram. It was unable to be completed b/c pt was not having active discharge. Should it return, they will schedule ductogram

## 2013-12-14 ENCOUNTER — Other Ambulatory Visit: Payer: Self-pay | Admitting: Family Medicine

## 2013-12-20 ENCOUNTER — Ambulatory Visit (INDEPENDENT_AMBULATORY_CARE_PROVIDER_SITE_OTHER): Payer: Managed Care, Other (non HMO) | Admitting: Family Medicine

## 2013-12-20 ENCOUNTER — Encounter: Payer: Self-pay | Admitting: Family Medicine

## 2013-12-20 VITALS — BP 124/82 | HR 100 | Temp 98.5°F | Wt 275.2 lb

## 2013-12-20 DIAGNOSIS — Z87898 Personal history of other specified conditions: Secondary | ICD-10-CM

## 2013-12-20 DIAGNOSIS — E282 Polycystic ovarian syndrome: Secondary | ICD-10-CM

## 2013-12-20 DIAGNOSIS — N6452 Nipple discharge: Secondary | ICD-10-CM

## 2013-12-20 DIAGNOSIS — E785 Hyperlipidemia, unspecified: Secondary | ICD-10-CM

## 2013-12-20 DIAGNOSIS — Z8639 Personal history of other endocrine, nutritional and metabolic disease: Secondary | ICD-10-CM

## 2013-12-20 NOTE — Patient Instructions (Addendum)
Great to see you. Please stop by to see Paula Oliver on your way out to set up your ductogram. Please return for fasting labs.

## 2013-12-20 NOTE — Assessment & Plan Note (Signed)
Congratulated her on some weight loss.  Encourage her to continue.

## 2013-12-20 NOTE — Assessment & Plan Note (Signed)
Discussed restarting statin. Metformin may have helped decrease her TG. We agreed to recheck fasting labs and make decision at that point. Orders entered.

## 2013-12-20 NOTE — Progress Notes (Signed)
Patient ID: Paula Oliver, female   DOB: [birth year], 43 y.o.   MRN: [id number]    Subjective:   Patient ID: Paula Oliver, female    DOB: 02-07-1971, 43 y.o.   MRN: [id number]  Paula Oliver is a pleasant 43 y.o. year old female who presents to clinic today with Follow-up  on 12/20/2013  HPI:  H/o pituitary tumor- surgically removed in 05/1995. She had noticed recently right yellowish/milky nipple discharge and pain which she felt when her pituitary tumor was diagnosed.  No HA or blurred vision.  Sent her for mammogram but she was not having nipple discharge at the time she went for ductogram so it was not performed. I also ordered MRI which insurance would not cover. Prolactin was low.  No blurred vision.  Two days ago, nipple discharge and breast pain restarted.  HLD- Had not been taking her cholesterol rx in several years. We did restart her metformin. Lab Results  Component Value Date   CHOL 256* 12/11/2013   HDL 34.30* 12/11/2013   LDLCALC Comment 11/28/2013   LDLDIRECT 141.2 12/11/2013   TRIG 490.0* 12/11/2013   CHOLHDL 7 12/11/2013   She has not been working on her diet but has been eating less- has lost some weight. Wt Readings from Last 3 Encounters:  12/20/13 275 lb 4 oz (124.853 kg)  11/28/13 279 lb 8 oz (126.78 kg)     Current Outpatient Prescriptions on File Prior to Visit  Medication Sig Dispense Refill  . fexofenadine (ALLEGRA) 180 MG tablet Take 180 mg by mouth daily.    . metFORMIN (GLUCOPHAGE XR) 500 MG 24 hr tablet Take 1 tablet (500 mg total) by mouth daily with breakfast. 30 tablet 3  . omeprazole (PRILOSEC) 20 MG capsule Take 20 mg by mouth daily.     No current facility-administered medications on file prior to visit.    Allergies  Allergen Reactions  . Azithromycin Other (See Comments)    Stomach cramping    Past Medical History  Diagnosis Date  . Depression   . GERD (gastroesophageal reflux disease)   . Allergy   .  Hyperlipidemia   . Headache   . Urinary incontinence   . PCOS (polycystic ovarian syndrome)   . Insulin resistance   . Pituitary tumor   . Endometriosis   . Uterine fibroid     Past Surgical History  Procedure Laterality Date  . Ankle reconstruction  2002  . Transphenoidal / transnasal hypophysectomy / resection pituitary tumor      Family History  Problem Relation Age of Onset  . Arthritis Mother   . Hyperlipidemia Mother   . Hypertension Mother   . Alcohol abuse Father   . Drug abuse Father   . Arthritis Father   . Hyperlipidemia Father   . Hypertension Father   . Mental illness Father   . Mental illness Brother   . Arthritis Maternal Grandmother   . Hyperlipidemia Maternal Grandmother   . Hypertension Maternal Grandmother   . Diabetes Maternal Grandmother   . Arthritis Maternal Grandfather   . Hyperlipidemia Maternal Grandfather     History   Social History  . Marital Status: Married    Spouse Name: N/A    Number of Children: N/A  . Years of Education: N/A   Occupational History  . Not on file.   Social History Main Topics  . Smoking status: Former Research scientist (life sciences)  . Smokeless tobacco: Never Used  . Alcohol Use: No  . Drug  Use: No  . Sexual Activity: No   Other Topics Concern  . Not on file   Social History Narrative   The PMH, PSH, Social History, Family History, Medications, and allergies have been reviewed in [facility], and have been updated if relevant.   Review of Systems  Constitutional: Negative.   Neurological: Negative for dizziness, tremors, seizures, syncope, facial asymmetry, speech difficulty, weakness, light-headedness, numbness and headaches.  All other systems reviewed and are negative.      Objective:    BP 124/82 mmHg  Pulse 100  Temp(Src) 98.5 F (36.9 C) (Oral)  Wt 275 lb 4 oz (124.853 kg)  SpO2 97%  LMP 11/29/2013   Physical Exam   General:  Obese, well-nourished,in no acute distress; alert,appropriate and cooperative  throughout examination Head:  normocephalic and atraumatic.   Psych:  Cognition and judgment appear intact. Alert and cooperative with normal attention span and concentration. No apparent delusions, illusions, hallucinations       Assessment & Plan:   HLD (hyperlipidemia) - Plan: Lipid panel  Nipple discharge in female  PCOS (polycystic ovarian syndrome)  History of pituitary tumor - Plan: Prolactin, MR Brain Wo Contrast No Follow-up on file.

## 2013-12-20 NOTE — Assessment & Plan Note (Signed)
Deteriorated. Reschedule ductogram immediately. MRI re ordered as well.

## 2013-12-20 NOTE — Progress Notes (Signed)
Pre visit review using our clinic review tool, if applicable. No additional management support is needed unless otherwise documented below in the visit note. 

## 2013-12-21 ENCOUNTER — Telehealth: Payer: Self-pay

## 2013-12-21 ENCOUNTER — Ambulatory Visit: Payer: Self-pay | Admitting: Family Medicine

## 2013-12-21 NOTE — Telephone Encounter (Signed)
[place] left msg on Marlynn Perking [place] for results--lmovm for representative from [place] [facility] to return my call--

## 2013-12-22 ENCOUNTER — Encounter: Payer: Self-pay | Admitting: Family Medicine

## 2013-12-22 ENCOUNTER — Other Ambulatory Visit: Payer: Self-pay | Admitting: Family Medicine

## 2013-12-22 DIAGNOSIS — N6452 Nipple discharge: Secondary | ICD-10-CM

## 2013-12-25 ENCOUNTER — Other Ambulatory Visit (INDEPENDENT_AMBULATORY_CARE_PROVIDER_SITE_OTHER): Payer: Managed Care, Other (non HMO)

## 2013-12-25 ENCOUNTER — Encounter: Payer: Self-pay | Admitting: General Surgery

## 2013-12-25 ENCOUNTER — Other Ambulatory Visit: Payer: Self-pay | Admitting: Family Medicine

## 2013-12-25 DIAGNOSIS — Z87898 Personal history of other specified conditions: Secondary | ICD-10-CM

## 2013-12-25 DIAGNOSIS — E785 Hyperlipidemia, unspecified: Secondary | ICD-10-CM

## 2013-12-25 LAB — LIPID PANEL
Cholesterol: 235 mg/dL — ABNORMAL HIGH (ref 0–200)
HDL: 28.3 mg/dL — ABNORMAL LOW (ref >39.00)
NONHDL: 206.7
Total CHOL/HDL Ratio: 8
Triglycerides: 437 mg/dL — ABNORMAL HIGH (ref 0.0–149.0)
VLDL: 87.4 mg/dL — ABNORMAL HIGH (ref 0.0–40.0)

## 2013-12-25 LAB — LDL CHOLESTEROL, DIRECT: Direct LDL: 142.6 mg/dL

## 2013-12-26 LAB — PROLACTIN: PROLACTIN: 4.7 ng/mL

## 2013-12-29 ENCOUNTER — Telehealth: Payer: Self-pay | Admitting: *Deleted

## 2013-12-29 ENCOUNTER — Encounter: Payer: Self-pay | Admitting: *Deleted

## 2013-12-29 MED ORDER — SIMVASTATIN 10 MG PO TABS: 10.0000 mg | ORAL_TABLET | Freq: Every day | ORAL | Status: DC

## 2013-12-29 NOTE — Telephone Encounter (Signed)
Lm on pts vm and informed Rx has been sent to requested pharmacy and to contact office to schedule follow up labs

## 2013-12-29 NOTE — Telephone Encounter (Signed)
Spoke to pt and informed her of results. Pt is willing to start lipid med and is requesting Rx be sent to [facility]

## 2013-12-29 NOTE — Telephone Encounter (Signed)
erx sent.  Please schedule follow up lipid panel, CMET in 4 weeks.

## 2014-01-01 ENCOUNTER — Telehealth: Payer: Self-pay

## 2014-01-01 ENCOUNTER — Encounter: Payer: Self-pay | Admitting: Family Medicine

## 2014-01-01 NOTE — Telephone Encounter (Signed)
Pt left v/m; pt started taking simvastatin on 12/30/13; pt developed dizziness,nausea, ear congestion and muscular pain in legs.These are possible side effects pt noted that came with medicine. Pt wants to know if should continue taking med or should pt stop med. Pt request cb [phone].

## 2014-01-01 NOTE — Telephone Encounter (Signed)
Spoke to pt and advised per Dr Aron; pt verbally expressed understanding.  

## 2014-01-01 NOTE — Telephone Encounter (Signed)
Let's stop it to see if her symptoms resolve.  Update [place] next week.

## 2014-01-09 ENCOUNTER — Ambulatory Visit: Payer: Self-pay | Admitting: General Surgery

## 2014-01-15 ENCOUNTER — Ambulatory Visit: Payer: Self-pay | Admitting: General Surgery

## 2014-01-23 ENCOUNTER — Telehealth: Payer: Self-pay | Admitting: Family Medicine

## 2014-01-23 ENCOUNTER — Ambulatory Visit (INDEPENDENT_AMBULATORY_CARE_PROVIDER_SITE_OTHER): Payer: Managed Care, Other (non HMO) | Admitting: General Surgery

## 2014-01-23 ENCOUNTER — Encounter: Payer: Self-pay | Admitting: General Surgery

## 2014-01-23 VITALS — BP 158/80 | HR 90 | Resp 16 | Ht 65.0 in | Wt 277.0 lb

## 2014-01-23 DIAGNOSIS — Z87898 Personal history of other specified conditions: Secondary | ICD-10-CM

## 2014-01-23 DIAGNOSIS — H539 Unspecified visual disturbance: Secondary | ICD-10-CM

## 2014-01-23 DIAGNOSIS — N6452 Nipple discharge: Secondary | ICD-10-CM | POA: Insufficient documentation

## 2014-01-23 DIAGNOSIS — H5340 Unspecified visual field defects: Secondary | ICD-10-CM

## 2014-01-23 NOTE — Patient Instructions (Signed)
Continue self breast exams. Call office for any new breast issues or concerns. 

## 2014-01-23 NOTE — Telephone Encounter (Signed)
Received a call from Dr. Bary Castilla who is seeing Paula Oliver today for nipple discharge.  He very appropriately wanted me to be aware that she told him of an episode of visual field deficit and other neuro symptoms that resolved.( see his note from 12/15) spontaneously.  Request for previous MRI was declined but this certainly necessitates MRI imaging of her brain given concerning symptoms with PMH significant for pituitary tumor.  MRI order entered again.

## 2014-01-23 NOTE — Progress Notes (Addendum)
Patient ID: Paula Oliver, female   DOB: [birth year], 43 y.o.   MRN: [id number]  Chief Complaint  Patient presents with  . Other    nipple discharge    HPI Paula Oliver is a 43 y.o. female.  Here today for right breast nipple discharge. She states she noticed Nov 11 2013 (when ICD 10 went into effect). She is a Government social research officer at [employer]. She does notice pain and tenderness in the right breast occasionally, not every day and last for about 1 week. When there is pain she notices nipple discharge that is white or yellow or even clear. No drainage of left breast. The pain starts at the right nipple and down around to the axilla area. Prolactin levels below normal, she has history of pituitary nodule which caused nipple discharge. She did breast feed her daughter, who is now 16.  Her last mammogram and ultrasound was Oct 2015.  She states for 2 weeks she has had occasional disorientation, that last about 30 seconds. She had visual disturbance, lateral vision went black on Friday, 01-19-14, that last 45 minutes. No headaches.   HPI  Past Medical History  Diagnosis Date  . Depression   . GERD (gastroesophageal reflux disease)   . Allergy   . Hyperlipidemia   . Headache   . Urinary incontinence   . PCOS (polycystic ovarian syndrome)   . Insulin resistance   . Pituitary tumor   . Endometriosis   . Uterine fibroid   . PCOS (polycystic ovarian syndrome)   . Arthritis   . Diabetes mellitus without complication     insulin resistance    Past Surgical History  Procedure Laterality Date  . Ankle reconstruction  03/2000  . Transphenoidal / transnasal hypophysectomy / resection pituitary tumor  05/1995    Dr Radford Pax  . Uterine laparoscopy  2004, 2006, 2007    Family History  Problem Relation Age of Onset  . Arthritis Mother   . Hyperlipidemia Mother   . Hypertension Mother   . Alcohol abuse Father   . Drug abuse Father   . Arthritis Father   . Hyperlipidemia Father   .  Hypertension Father   . Mental illness Father   . Mental illness Brother   . Arthritis Maternal Grandmother   . Hyperlipidemia Maternal Grandmother   . Hypertension Maternal Grandmother   . Diabetes Maternal Grandmother   . Arthritis Maternal Grandfather   . Hyperlipidemia Maternal Grandfather     Social History History  Substance Use Topics  . Smoking status: Former Smoker -- 2 years    Quit date: 02/10/1999  . Smokeless tobacco: Never Used  . Alcohol Use: No    Allergies  Allergen Reactions  . Azithromycin Other (See Comments)    Stomach cramping    Current Outpatient Prescriptions  Medication Sig Dispense Refill  . b complex vitamins capsule Take 1 capsule by mouth daily.    . metFORMIN (GLUCOPHAGE XR) 500 MG 24 hr tablet Take 1 tablet (500 mg total) by mouth daily with breakfast. 30 tablet 3  . Multiple Vitamin (MULTIVITAMIN) tablet Take 1 tablet by mouth daily.    . Omega-3 Fatty Acids (FISH OIL PO) Take by mouth.    Marland Kitchen omeprazole (PRILOSEC) 20 MG capsule Take 20 mg by mouth daily.    . fexofenadine (ALLEGRA) 180 MG tablet Take 180 mg by mouth daily.     No current facility-administered medications for this visit.    Review of Systems Review of Systems  Constitutional: Negative.   Eyes: Positive for visual disturbance.  Respiratory: Negative.   Cardiovascular: Negative.   Neurological: Negative for headaches.    Blood pressure 158/80, pulse 90, resp. rate 16, height 5\' 5"  (1.651 m), weight 277 lb (125.646 kg), last menstrual period 01/08/2014.  Physical Exam Physical Exam  Constitutional: She is oriented to person, place, and time. She appears well-developed and well-nourished.  Eyes: EOM are normal. Pupils are equal, round, and reactive to light.  No gross visual field distortion.   Neck: Neck supple.  Cardiovascular: Normal rate, regular rhythm and normal heart sounds.   Pulmonary/Chest: Effort normal and breath sounds normal. Right breast exhibits no  inverted nipple, no mass, no nipple discharge, no skin change and no tenderness. Left breast exhibits no inverted nipple, no mass, no nipple discharge, no skin change and no tenderness.    Lymphadenopathy:    She has no cervical adenopathy.    She has no axillary adenopathy.  Neurological: She is alert and oriented to person, place, and time.  Skin: Skin is warm and dry.    Data Reviewed 2015 mammograms and ultrasound exams. Serum prolactin level is not elevated.   Assessment    Benign breast exam.    Recent visual field disturbance.    Plan    Annual screening mammograms indicated. No indication for breast MRI at this time. Report of abnormal visual symptoms of concern.  No clinical indication for immediate MRI at this time, but an MRI of the brain/ pituitary would be appropriate based on past history of pituitary adenoma      Called and talked to Dr. Deborra Medina about the visual disturbance. She sees Dr. Lorie Apley and her last visit was a couple months ago. She will contact him today for re-evaluation.  Follow up on an as needed basis. Continue self breast exams. Call office for any new breast issues or concerns.  PCPRef:  Geoffery Lyons 01/24/2014, 5:16 AM

## 2014-02-07 ENCOUNTER — Ambulatory Visit: Payer: Self-pay | Admitting: Family Medicine

## 2014-02-08 ENCOUNTER — Encounter: Payer: Self-pay | Admitting: Family Medicine

## 2014-02-14 ENCOUNTER — Encounter: Payer: Self-pay | Admitting: *Deleted

## 2014-02-28 ENCOUNTER — Other Ambulatory Visit: Payer: Self-pay | Admitting: Internal Medicine

## 2014-12-27 ENCOUNTER — Ambulatory Visit (INDEPENDENT_AMBULATORY_CARE_PROVIDER_SITE_OTHER): Payer: Managed Care, Other (non HMO) | Admitting: Internal Medicine

## 2014-12-27 ENCOUNTER — Encounter: Payer: Self-pay | Admitting: Internal Medicine

## 2014-12-27 VITALS — BP 134/86 | HR 82 | Temp 98.2°F | Wt 281.0 lb

## 2014-12-27 DIAGNOSIS — J329 Chronic sinusitis, unspecified: Secondary | ICD-10-CM | POA: Diagnosis not present

## 2014-12-27 NOTE — Patient Instructions (Signed)

## 2014-12-27 NOTE — Progress Notes (Signed)
Pre visit review using our clinic review tool, if applicable. No additional management support is needed unless otherwise documented below in the visit note. 

## 2014-12-27 NOTE — Progress Notes (Signed)
HPI  Pt presents to the clinic today to follow up ongoing sinus infection. This started about 1 month ago. She did the MD Live at work and was put on a course of Augmentin. She took the entire 10 days but not notice any improvement. She went to UC 4 days ago for the same and was put on Prednisone and Doxycycline. She is only on day 4 of the Doxycycline but reports she can take the Prednisone. She took the first dose and it made her skin crawl. She continues to headache, facial pain and pressure, nasal congestion and cough. She is not able to blow anything out of her nose. The cough is productive of green mucous. She denies fever, chills or body aches. She has tried Mucinex, Ibuprofen, Allegra and Flonase without any relief. She has had sinus infection before but she reports they have never been this bad. She has not had sick contacts that she is aware of.  Review of Systems    Past Medical History  Diagnosis Date  . Depression   . GERD (gastroesophageal reflux disease)   . Allergy   . Hyperlipidemia   . Headache   . Urinary incontinence   . PCOS (polycystic ovarian syndrome)   . Insulin resistance   . Pituitary tumor ([place])   . Endometriosis   . Uterine fibroid   . PCOS (polycystic ovarian syndrome)   . Arthritis   . Diabetes mellitus without complication (HCC)     insulin resistance    Family History  Problem Relation Age of Onset  . Arthritis Mother   . Hyperlipidemia Mother   . Hypertension Mother   . Alcohol abuse Father   . Drug abuse Father   . Arthritis Father   . Hyperlipidemia Father   . Hypertension Father   . Mental illness Father   . Mental illness Brother   . Arthritis Maternal Grandmother   . Hyperlipidemia Maternal Grandmother   . Hypertension Maternal Grandmother   . Diabetes Maternal Grandmother   . Arthritis Maternal Grandfather   . Hyperlipidemia Maternal Grandfather     Social History   Social History  . Marital Status: Married    Spouse Name: N/A   . Number of Children: N/A  . Years of Education: N/A   Occupational History  . Not on file.   Social History Main Topics  . Smoking status: Former Smoker -- 2 years    Quit date: 02/10/1999  . Smokeless tobacco: Never Used  . Alcohol Use: No  . Drug Use: No  . Sexual Activity: No   Other Topics Concern  . Not on file   Social History Narrative    Allergies  Allergen Reactions  . Azithromycin Other (See Comments)    Stomach cramping     Constitutional: Positive headache, fatigue. Denies fever or abrupt weight changes.  HEENT:  Positive facial pain, nasal congestion and sore throat. Denies eye redness, ear pain, ringing in the ears, wax buildup, runny nose or bloody nose. Respiratory: Positive cough. Denies difficulty breathing or shortness of breath.  Cardiovascular: Denies chest pain, chest tightness, palpitations or swelling in the hands or feet.   No other specific complaints in a complete review of systems (except as listed in HPI above).  Objective:   BP 134/86 mmHg  Pulse 82  Temp(Src) 98.2 F (36.8 C) (Oral)  Wt 281 lb (127.461 kg)  SpO2 98%  General: Appears her stated age, ill appearing in NAD. HEENT: Head: normal shape and size,  maxillary sinus tenderness noted; Eyes: sclera white, no icterus, conjunctiva pink; Ears: Tm's gray and intact, normal light reflex; Nose: mucosa boggy and moist, septum midline; Throat/Mouth: + PND. Teeth present, mucosa erythematous and moist, no exudate noted, no lesions or ulcerations noted.  Neck:  No lymphadenopathy noted.  Cardiovascular: Normal rate and rhythm. S1,S2 noted.  No murmur, rubs or gallops noted.  Pulmonary/Chest: Normal effort and positive vesicular breath sounds. No respiratory distress. No wheezes, rales or ronchi noted.      Assessment & Plan:   Recurrent sinusitis  Continue Allegra and Flonase Advised her to try a Neti Pot OTC She should continue her course of Doxycycline until it is finished Advised  her to give the Prednisone one more chance- if she has same reaction, then she should stop If no improvement, she will need referral to ENT  RTC as needed or if symptoms persist.

## 2014-12-31 ENCOUNTER — Other Ambulatory Visit: Payer: Self-pay | Admitting: Family Medicine

## 2014-12-31 ENCOUNTER — Other Ambulatory Visit (INDEPENDENT_AMBULATORY_CARE_PROVIDER_SITE_OTHER): Payer: Managed Care, Other (non HMO)

## 2014-12-31 DIAGNOSIS — Z01419 Encounter for gynecological examination (general) (routine) without abnormal findings: Secondary | ICD-10-CM

## 2014-12-31 NOTE — Addendum Note (Signed)
Addended by: Ellamae Sia on: 12/31/2014 11:06 AM   Modules accepted: Orders

## 2015-01-01 LAB — CBC WITH DIFFERENTIAL/PLATELET
BASOS: 0 %
Basophils Absolute: 0 10*3/uL (ref 0.0–0.2)
EOS (ABSOLUTE): 0.1 10*3/uL (ref 0.0–0.4)
Eos: 2 %
HEMATOCRIT: 34 % (ref 34.0–46.6)
HEMOGLOBIN: 11.2 g/dL (ref 11.1–15.9)
IMMATURE GRANS (ABS): 0 10*3/uL (ref 0.0–0.1)
Immature Granulocytes: 0 %
LYMPHS ABS: 2.7 10*3/uL (ref 0.7–3.1)
LYMPHS: 40 %
MCH: 25.2 pg — AB (ref 26.6–33.0)
MCHC: 32.9 g/dL (ref 31.5–35.7)
MCV: 77 fL — AB (ref 79–97)
MONOCYTES: 6 %
Monocytes Absolute: 0.4 10*3/uL (ref 0.1–0.9)
NEUTROS ABS: 3.5 10*3/uL (ref 1.4–7.0)
Neutrophils: 52 %
Platelets: 214 10*3/uL (ref 150–379)
RBC: 4.44 x10E6/uL (ref 3.77–5.28)
RDW: 15.9 % — ABNORMAL HIGH (ref 12.3–15.4)
WBC: 6.8 10*3/uL (ref 3.4–10.8)

## 2015-01-01 LAB — COMPREHENSIVE METABOLIC PANEL
A/G RATIO: 1.8 (ref 1.1–2.5)
ALT: 16 IU/L (ref 0–32)
AST: 13 IU/L (ref 0–40)
Albumin: 3.9 g/dL (ref 3.5–5.5)
Alkaline Phosphatase: 105 IU/L (ref 39–117)
BUN/Creatinine Ratio: 19 (ref 9–23)
BUN: 13 mg/dL (ref 6–24)
CHLORIDE: 103 mmol/L (ref 97–106)
CO2: 24 mmol/L (ref 18–29)
Calcium: 8.7 mg/dL (ref 8.7–10.2)
Creatinine, Ser: 0.69 mg/dL (ref 0.57–1.00)
GFR, EST AFRICAN AMERICAN: 122 mL/min/{1.73_m2} (ref >59)
GFR, EST NON AFRICAN AMERICAN: 106 mL/min/{1.73_m2} (ref >59)
GLOBULIN, TOTAL: 2.2 g/dL (ref 1.5–4.5)
Glucose: 100 mg/dL — ABNORMAL HIGH (ref 65–99)
POTASSIUM: 4.5 mmol/L (ref 3.5–5.2)
SODIUM: 142 mmol/L (ref 136–144)
TOTAL PROTEIN: 6.1 g/dL (ref 6.0–8.5)

## 2015-01-01 LAB — HEMOGLOBIN A1C
Est. average glucose Bld gHb Est-mCnc: 114 mg/dL
Hgb A1c MFr Bld: 5.6 % (ref 4.8–5.6)

## 2015-01-01 LAB — LIPID PANEL
CHOL/HDL RATIO: 8.2 ratio — AB (ref 0.0–4.4)
CHOLESTEROL TOTAL: 221 mg/dL — AB (ref 100–199)
HDL: 27 mg/dL — AB (ref >39)
TRIGLYCERIDES: 506 mg/dL — AB (ref 0–149)

## 2015-01-01 LAB — TSH: TSH: 1.03 u[IU]/mL (ref 0.450–4.500)

## 2015-01-01 LAB — HIV ANTIBODY (ROUTINE TESTING W REFLEX): HIV Screen 4th Generation wRfx: NONREACTIVE

## 2015-01-02 ENCOUNTER — Encounter: Payer: Self-pay | Admitting: Family Medicine

## 2015-01-02 ENCOUNTER — Ambulatory Visit (INDEPENDENT_AMBULATORY_CARE_PROVIDER_SITE_OTHER): Payer: Managed Care, Other (non HMO) | Admitting: Family Medicine

## 2015-01-02 VITALS — BP 138/92 | HR 81 | Temp 98.1°F | Ht 65.25 in | Wt 281.2 lb

## 2015-01-02 DIAGNOSIS — G629 Polyneuropathy, unspecified: Secondary | ICD-10-CM | POA: Diagnosis not present

## 2015-01-02 DIAGNOSIS — Z8639 Personal history of other endocrine, nutritional and metabolic disease: Secondary | ICD-10-CM

## 2015-01-02 DIAGNOSIS — Z01419 Encounter for gynecological examination (general) (routine) without abnormal findings: Secondary | ICD-10-CM

## 2015-01-02 DIAGNOSIS — Z87898 Personal history of other specified conditions: Secondary | ICD-10-CM

## 2015-01-02 DIAGNOSIS — Z23 Encounter for immunization: Secondary | ICD-10-CM | POA: Diagnosis not present

## 2015-01-02 DIAGNOSIS — F418 Other specified anxiety disorders: Secondary | ICD-10-CM | POA: Insufficient documentation

## 2015-01-02 DIAGNOSIS — E785 Hyperlipidemia, unspecified: Secondary | ICD-10-CM | POA: Diagnosis not present

## 2015-01-02 DIAGNOSIS — Z0000 Encounter for general adult medical examination without abnormal findings: Secondary | ICD-10-CM | POA: Diagnosis not present

## 2015-01-02 MED ORDER — ALPRAZOLAM 0.25 MG PO TABS: 0.2500 mg | ORAL_TABLET | Freq: Every evening | ORAL | Status: DC | PRN

## 2015-01-02 MED ORDER — FENOFIBRATE 54 MG PO TABS: 54.0000 mg | ORAL_TABLET | Freq: Every day | ORAL | Status: DC

## 2015-01-02 NOTE — Addendum Note (Signed)
Addended by: Modena Nunnery on: 01/02/2015 01:32 PM   Modules accepted: Orders

## 2015-01-02 NOTE — Addendum Note (Signed)
Addended by: Lucille Passy on: 01/02/2015 01:34 PM   Modules accepted: Orders, SmartSet

## 2015-01-02 NOTE — Progress Notes (Addendum)
Subjective:    Patient ID: Paula Oliver, female    DOB: 10-23-1970, 44 y.o.   MRN: [id number]  HPI  Very pleasant 44 yo G3P1 with h/o PCOS, morbid obesity, pituitary tumor,HLD here for CPX.  Last pap smear 11/28/13- done by me.  Last abnormal pap smear in 1999. Mammogram 12/08/13  Lab Results  Component Value Date   HGBA1C 5.6 12/31/2014   H/o pituitary tumor- surgically removed in 05/1995. MRI of brain w  wo contract on 02/07/14 reassuring- no evidence of recurrent tumor.  Been under a little more stress lately.  Asking for refill of prn xanax.  Denies feeling depressed.  No SI or HI.  Periods have been heavy and irregular.  Triglycerides very elevated last year and remain elevated this year- did not return our calls about starting rx. She does admit to drinking regular sodas and has a family h/o of elevated TG.  Lab Results  Component Value Date   CHOL 221* 12/31/2014   HDL 27* 12/31/2014   LDLCALC Comment 12/31/2014   LDLDIRECT 142.6 12/25/2013   TRIG 506* 12/31/2014   CHOLHDL 8.2* 12/31/2014   Saw Webb Silversmith, NP, last week on 11/17 for cough. Note reviewed.  Had been to UC earlier that week- given doxycyline. Advised continued doxycycline, allegra, flonase and Neti Pot. Symptoms are "almost gone."  Current Outpatient Prescriptions on File Prior to Visit  Medication Sig Dispense Refill  . fexofenadine (ALLEGRA) 180 MG tablet Take 180 mg by mouth daily.    . fluticasone (FLONASE) 50 MCG/ACT nasal spray Place 2 sprays into both nostrils daily.  0  . Multiple Vitamin (MULTIVITAMIN) tablet Take 1 tablet by mouth daily.    Marland Kitchen omeprazole (PRILOSEC) 20 MG capsule Take 20 mg by mouth daily.    . metFORMIN (GLUCOPHAGE XR) 500 MG 24 hr tablet Take 1 tablet (500 mg total) by mouth daily with breakfast. (Patient not taking: Reported on 01/02/2015) 30 tablet 3   No current facility-administered medications on file prior to visit.    Allergies  Allergen Reactions  .  Azithromycin Other (See Comments)    Stomach cramping    Past Medical History  Diagnosis Date  . Depression   . GERD (gastroesophageal reflux disease)   . Allergy   . Hyperlipidemia   . Headache   . Urinary incontinence   . PCOS (polycystic ovarian syndrome)   . Insulin resistance   . Pituitary tumor ([place])   . Endometriosis   . Uterine fibroid   . PCOS (polycystic ovarian syndrome)   . Arthritis   . Diabetes mellitus without complication (HCC)     insulin resistance    Past Surgical History  Procedure Laterality Date  . Ankle reconstruction  03/2000  . Transphenoidal / transnasal hypophysectomy / resection pituitary tumor  05/1995    Dr Radford Pax  . Uterine laparoscopy  2004, 2006, 2007    Family History  Problem Relation Age of Onset  . Arthritis Mother   . Hyperlipidemia Mother   . Hypertension Mother   . Alcohol abuse Father   . Drug abuse Father   . Arthritis Father   . Hyperlipidemia Father   . Hypertension Father   . Mental illness Father   . Mental illness Brother   . Arthritis Maternal Grandmother   . Hyperlipidemia Maternal Grandmother   . Hypertension Maternal Grandmother   . Diabetes Maternal Grandmother   . Arthritis Maternal Grandfather   . Hyperlipidemia Maternal Grandfather     Social  History   Social History  . Marital Status: Married    Spouse Name: N/A  . Number of Children: N/A  . Years of Education: N/A   Occupational History  . Not on file.   Social History Main Topics  . Smoking status: Former Smoker -- 2 years    Quit date: 02/10/1999  . Smokeless tobacco: Never Used  . Alcohol Use: No  . Drug Use: No  . Sexual Activity: No   Other Topics Concern  . Not on file   Social History Narrative   The PMH, PSH, Social History, Family History, Medications, and allergies have been reviewed in [facility], and have been updated if relevant.   Review of Systems  Constitutional: Negative for activity change, appetite change and unexpected  weight change.  HENT: Negative.  Negative for trouble swallowing.   Eyes: Negative for photophobia, pain, redness and visual disturbance.  Respiratory: Negative for chest tightness and shortness of breath.   Cardiovascular: Negative for chest pain, palpitations and leg swelling.  Gastrointestinal: Negative for nausea, vomiting, diarrhea and constipation.  Endocrine: Negative for polydipsia, polyphagia and polyuria.  Genitourinary: Negative for dysuria, vaginal bleeding, vaginal discharge, vaginal pain and menstrual problem.  Musculoskeletal: Negative.   Skin: Negative.   Neurological: Negative for dizziness, tremors, seizures, syncope, speech difficulty, weakness and numbness.  Hematological: Negative.   Psychiatric/Behavioral: Negative for suicidal ideas, behavioral problems, sleep disturbance, self-injury and agitation. The patient is nervous/anxious.   All other systems reviewed and are negative.  See HPI    Objective:   Physical Exam  Constitutional: She is oriented to person, place, and time. She appears well-developed.  Obese, hirsutism present  HENT:  Head: Normocephalic and atraumatic.  Eyes: Conjunctivae and EOM are normal. Pupils are equal, round, and reactive to light.  Neck: Normal range of motion. Neck supple. No thyromegaly present.  Cardiovascular: Normal rate, regular rhythm and normal heart sounds.   Pulmonary/Chest: Effort normal and breath sounds normal. Right breast exhibits no inverted nipple, no mass, no nipple discharge, no skin change and no tenderness. Left breast exhibits no inverted nipple, no mass, no nipple discharge, no skin change and no tenderness. Breasts are symmetrical.  Abdominal: Soft. Bowel sounds are normal. She exhibits no distension. There is no tenderness.  Genitourinary: Rectum normal, vagina normal and uterus normal. Cervix exhibits no motion tenderness, no discharge and no friability. Right adnexum displays no mass, no tenderness and no  fullness. Left adnexum displays no mass, no tenderness and no fullness.  Neurological: She is alert and oriented to person, place, and time. She has normal reflexes. No cranial nerve deficit. Coordination normal.  Skin: Skin is warm and dry.  Psychiatric: She has a normal mood and affect. Her behavior is normal. Judgment and thought content normal.  Nursing note and vitals reviewed.  BP 138/92 mmHg  Pulse 81  Temp(Src) 98.1 F (36.7 C) (Oral)  Ht 5' 5.25" (1.657 m)  Wt 281 lb 4 oz (127.574 kg)  BMI 46.46 kg/m2  SpO2 97%  LMP 12/10/2014        Assessment & Plan:

## 2015-01-02 NOTE — Assessment & Plan Note (Signed)
Reassuring MRI last year.

## 2015-01-02 NOTE — Assessment & Plan Note (Addendum)
Reviewed preventive care protocols, scheduled due services, and updated immunizations Discussed nutrition, exercise, diet, and healthy lifestyle.  Influenza vaccine today.  Pt to schedule mammogram.

## 2015-01-02 NOTE — Progress Notes (Signed)
Pre visit review using our clinic review tool, if applicable. No additional management support is needed unless otherwise documented below in the visit note. 

## 2015-01-02 NOTE — Patient Instructions (Addendum)
Please call to schedule your mammogram.  We are starting Tricor daily.  Please come back in 2 months to recheck your blood work.  Triglycerides are too high.  Weight loss (even small amount) can decrease triglycerides.  Decrease added sugars, eliminate trans fats, increase fiber and limit alcohol.  All these changes together can drop triglycerides by almost 50%.  Happy Thanksgiving!

## 2015-01-02 NOTE — Assessment & Plan Note (Signed)
Deteriorated. Very elevated TG. Start Tricor. Discussed TG friendly diet- see AVS.

## 2015-01-02 NOTE — Assessment & Plan Note (Signed)
Rx for prn xanax refilled. She is aware of sedation and addiction potential.

## 2015-02-25 ENCOUNTER — Encounter: Payer: Self-pay | Admitting: Emergency Medicine

## 2015-02-25 ENCOUNTER — Telehealth: Payer: Self-pay | Admitting: *Deleted

## 2015-02-25 ENCOUNTER — Emergency Department
Admission: EM | Admit: 2015-02-25 | Discharge: 2015-02-25 | Disposition: A | Discharge status: Home / Self Care | Payer: Managed Care, Other (non HMO) | Attending: Emergency Medicine | Admitting: Emergency Medicine

## 2015-02-25 DIAGNOSIS — R002 Palpitations: Secondary | ICD-10-CM | POA: Insufficient documentation

## 2015-02-25 DIAGNOSIS — Z87891 Personal history of nicotine dependence: Secondary | ICD-10-CM | POA: Diagnosis not present

## 2015-02-25 DIAGNOSIS — E119 Type 2 diabetes mellitus without complications: Secondary | ICD-10-CM | POA: Insufficient documentation

## 2015-02-25 DIAGNOSIS — R42 Dizziness and giddiness: Secondary | ICD-10-CM | POA: Insufficient documentation

## 2015-02-25 LAB — COMPREHENSIVE METABOLIC PANEL
ALBUMIN: 3.6 g/dL (ref 3.5–5.0)
ALK PHOS: 79 U/L (ref 38–126)
ALT: 18 U/L (ref 14–54)
AST: 19 U/L (ref 15–41)
Anion gap: 7 (ref 5–15)
BILIRUBIN TOTAL: 0.2 mg/dL — AB (ref 0.3–1.2)
BUN: 14 mg/dL (ref 6–20)
CALCIUM: 8.7 mg/dL — AB (ref 8.9–10.3)
CO2: 25 mmol/L (ref 22–32)
Chloride: 106 mmol/L (ref 101–111)
Creatinine, Ser: 0.82 mg/dL (ref 0.44–1.00)
GFR calc Af Amer: >60 mL/min (ref >60)
GFR calc non Af Amer: >60 mL/min (ref >60)
GLUCOSE: 146 mg/dL — AB (ref 65–99)
Potassium: 3.5 mmol/L (ref 3.5–5.1)
Sodium: 138 mmol/L (ref 135–145)
TOTAL PROTEIN: 6.5 g/dL (ref 6.5–8.1)

## 2015-02-25 LAB — CBC
HEMATOCRIT: 33.7 % — AB (ref 35.0–47.0)
HEMOGLOBIN: 11 g/dL — AB (ref 12.0–16.0)
MCH: 24.5 pg — ABNORMAL LOW (ref 26.0–34.0)
MCHC: 32.6 g/dL (ref 32.0–36.0)
MCV: 75 fL — ABNORMAL LOW (ref 80.0–100.0)
Platelets: 169 10*3/uL (ref 150–440)
RBC: 4.5 MIL/uL (ref 3.80–5.20)
RDW: 15.5 % — ABNORMAL HIGH (ref 11.5–14.5)
WBC: 6.3 10*3/uL (ref 3.6–11.0)

## 2015-02-25 LAB — TROPONIN I: Troponin I: <0.03 ng/mL (ref <0.031)

## 2015-02-25 NOTE — Telephone Encounter (Signed)
Patient presented to the clinic by herself c/o chest pressure and palpitations.  She is having episodes of dizziness and nausea.  She did have some facial flushing earlier today that has since resolved.  We do not have any availability in the clinic.  Due to nature of symptoms, advised ED visit for further evaluation.  Offered to call EMS for transport, patient declined.  She states she will drive to [facility] ED to be seen now.

## 2015-02-25 NOTE — ED Notes (Signed)
States she developed some palpitations while at lunch   States she became flushed and dizzy  Worse of the sx's last about 1 hour   Feels better at present.

## 2015-02-25 NOTE — Discharge Instructions (Signed)

## 2015-02-25 NOTE — Telephone Encounter (Signed)
Please call pt tomorrow to check on her.

## 2015-02-25 NOTE — ED Provider Notes (Signed)
[facility] Emergency Department Provider Note     Time seen: ----------------------------------------- 5:03 PM on 02/25/2015 -----------------------------------------    I have reviewed the triage vital signs and the nursing notes.   HISTORY  Chief Complaint Palpitations    HPI Paula Oliver is a 45 y.o. female who presents ER for some palpitations while at lunch. Patient states she became flushed and dizzy, the worst part of her symptoms lasted about an hour. Patient feels better and has no complaints now. Patient denies feeling like her heart was racing, states it was skipping a beat. She denies fevers chills or other complaints.   Past Medical History  Diagnosis Date  . Depression   . GERD (gastroesophageal reflux disease)   . Allergy   . Hyperlipidemia   . Headache   . Urinary incontinence   . PCOS (polycystic ovarian syndrome)   . Insulin resistance   . Pituitary tumor ([place])   . Endometriosis   . Uterine fibroid   . PCOS (polycystic ovarian syndrome)   . Arthritis   . Diabetes mellitus without complication ([place])     insulin resistance    Patient Active Problem List   Diagnosis Date Noted  . Situational anxiety 01/02/2015  . HLD (hyperlipidemia) 11/28/2013  . PCOS (polycystic ovarian syndrome) 11/28/2013  . Neuropathy ([place]) 11/28/2013  . Severe obesity (BMI >= 40) ([place]) 11/28/2013  . Well woman exam with routine gynecological exam 11/28/2013  . History of pituitary tumor 11/28/2013  . Menorrhagia 11/28/2013    Past Surgical History  Procedure Laterality Date  . Ankle reconstruction  03/2000  . Transphenoidal / transnasal hypophysectomy / resection pituitary tumor  05/1995    Dr Radford Pax  . Uterine laparoscopy  2004, 2006, 2007    Allergies Azithromycin  Social History Social History  Substance Use Topics  . Smoking status: Former Smoker -- 2 years    Quit date: 02/10/1999  . Smokeless tobacco: Never Used  . Alcohol  Use: No    Review of Systems Constitutional: Negative for fever. Eyes: Negative for visual changes. ENT: Negative for sore throat. Cardiovascular: Negative for chest pain. Positive for palpitations Respiratory: Negative for shortness of breath. Gastrointestinal: Negative for abdominal pain, vomiting and diarrhea. Genitourinary: Negative for dysuria. Musculoskeletal: Negative for back pain. Skin: Negative for rash. Neurological: Negative for headaches, focal weakness or numbness.  10-point ROS otherwise negative.  ____________________________________________   PHYSICAL EXAM:  VITAL SIGNS: ED Triage Vitals  Enc Vitals Group     BP 02/25/15 1421 153/77 mmHg     Pulse Rate 02/25/15 1421 96     Resp 02/25/15 1421 8     Temp 02/25/15 1421 98.1 F (36.7 C)     Temp Source 02/25/15 1421 Oral     SpO2 02/25/15 1421 96 %     Weight 02/25/15 1421 280 lb (127.007 kg)     Height 02/25/15 1421 5\' 5"  (1.651 m)     Head Cir --      Peak Flow --      Pain Score 02/25/15 1424 3     Pain Loc --      Pain Edu? --      Excl. in [place]? --     Constitutional: Alert and oriented. Well appearing and in no distress. Eyes: Conjunctivae are normal. PERRL. Normal extraocular movements. ENT   Head: Normocephalic and atraumatic.   Nose: No congestion/rhinnorhea.   Mouth/Throat: Mucous membranes are moist.   Neck: No stridor. Cardiovascular: Normal rate,  regular rhythm. Normal and symmetric distal pulses are present in all extremities. No murmurs, rubs, or gallops. Respiratory: Normal respiratory effort without tachypnea nor retractions. Breath sounds are clear and equal bilaterally. No wheezes/rales/rhonchi. Gastrointestinal: Soft and nontender. No distention. No abdominal bruits.  Musculoskeletal: Nontender with normal range of motion in all extremities. No joint effusions.  No lower extremity tenderness nor edema. Neurologic:  Normal speech and language. No gross focal neurologic  deficits are appreciated. Speech is normal.  Skin:  Skin is warm, dry and intact. No rash noted. Psychiatric: Mood and affect are normal. Speech and behavior are normal. Patient exhibits appropriate insight and judgment. ____________________________________________  EKG: Interpreted by me. Normal sinus rhythm rate 83 bpm, normal PR interval, normal QRS, normal QT interval. Normal axis.  ____________________________________________  ED COURSE:  Pertinent labs & imaging results that were available during my care of the patient were reviewed by me and considered in my medical decision making (see chart for details). Patient with nonspecific palpitations, check basic labs and reevaluate. ____________________________________________    LABS (pertinent positives/negatives)  Labs Reviewed  CBC - Abnormal; Notable for the following:    Hemoglobin 11.0 (*)    HCT 33.7 (*)    MCV 75.0 (*)    MCH 24.5 (*)    RDW 15.5 (*)    All other components within normal limits  COMPREHENSIVE METABOLIC PANEL - Abnormal; Notable for the following:    Glucose, Bld 146 (*)    Calcium 8.7 (*)    Total Bilirubin 0.2 (*)    All other components within normal limits  TROPONIN I   ____________________________________________  FINAL ASSESSMENT AND PLAN  Palpitations  Plan: Patient with labs and imaging as dictated above. Unclear etiology for symptoms. It looks like she may have a mild iron deficiency anemia, I will advise multivitamin with iron. She is stable for outpatient follow-up with cardiology.   Earleen Newport, MD   Earleen Newport, MD 02/25/15 [phone]

## 2015-02-25 NOTE — ED Notes (Signed)
Intermittent palpitations began 1pm and lasted approx 1 hour. Nauseated.

## 2015-02-26 ENCOUNTER — Telehealth: Payer: Self-pay | Admitting: Family Medicine

## 2015-02-26 DIAGNOSIS — Z1239 Encounter for other screening for malignant neoplasm of breast: Secondary | ICD-10-CM

## 2015-02-26 NOTE — Telephone Encounter (Signed)
Pt called and states she is feeling better from yesterday's visit and has started an iron supplement.  She also said she called to schedule her follow up mammo from last year's and they informed her that they needed an order sent to them for a diagnostic mammo.   You can reach pt at [phone].

## 2015-02-26 NOTE — Telephone Encounter (Signed)
Order placed

## 2015-02-26 NOTE — Telephone Encounter (Signed)
Lm on pts vm requesting a call back 

## 2015-02-28 NOTE — Telephone Encounter (Signed)
Orders placed.

## 2015-02-28 NOTE — Telephone Encounter (Signed)
For ms [facility]'s mammogram [place] needs a uni left and uni right ultra sound before they will make appointment  thanks

## 2015-02-28 NOTE — Addendum Note (Signed)
Addended by: Lucille Passy on: 02/28/2015 11:42 AM   Modules accepted: Orders

## 2015-03-18 ENCOUNTER — Other Ambulatory Visit: Payer: Managed Care, Other (non HMO)

## 2015-03-18 ENCOUNTER — Ambulatory Visit: Payer: Managed Care, Other (non HMO)

## 2015-03-29 ENCOUNTER — Other Ambulatory Visit: Payer: Self-pay | Admitting: Family Medicine

## 2015-03-29 ENCOUNTER — Ambulatory Visit
Admission: RE | Admit: 2015-03-29 | Discharge: 2015-03-29 | Disposition: A | Discharge status: Home / Self Care | Payer: Managed Care, Other (non HMO) | Source: Ambulatory Visit | Attending: Family Medicine | Admitting: Family Medicine

## 2015-03-29 DIAGNOSIS — N6489 Other specified disorders of breast: Secondary | ICD-10-CM | POA: Diagnosis present

## 2015-03-29 DIAGNOSIS — Z1239 Encounter for other screening for malignant neoplasm of breast: Secondary | ICD-10-CM

## 2015-07-21 ENCOUNTER — Other Ambulatory Visit: Payer: Self-pay | Admitting: Family Medicine

## 2015-07-22 NOTE — Telephone Encounter (Signed)
Alprazolam called into [facility] [address] 

## 2015-07-22 NOTE — Telephone Encounter (Signed)
Last office visit 01/02/2015.  Last refilled 01/05/2015 for #20 with no refills.  Ok to refill?

## 2015-07-23 ENCOUNTER — Encounter
Admission: RE | Admit: 2015-07-23 | Discharge: 2015-07-23 | Disposition: A | Discharge status: Home / Self Care | Payer: Managed Care, Other (non HMO) | Source: Ambulatory Visit | Attending: Obstetrics & Gynecology | Admitting: Obstetrics & Gynecology

## 2015-07-23 DIAGNOSIS — Z01812 Encounter for preprocedural laboratory examination: Secondary | ICD-10-CM | POA: Insufficient documentation

## 2015-07-23 HISTORY — DX: Nausea with vomiting, unspecified: R11.2

## 2015-07-23 HISTORY — DX: Other complications of anesthesia, initial encounter: T88.59XA

## 2015-07-23 HISTORY — DX: Other specified postprocedural states: Z98.890

## 2015-07-23 HISTORY — DX: Adverse effect of unspecified anesthetic, initial encounter: T41.45XA

## 2015-07-23 LAB — CBC
HCT: 35.9 % (ref 35.0–47.0)
Hemoglobin: 11.6 g/dL — ABNORMAL LOW (ref 12.0–16.0)
MCH: 24.3 pg — ABNORMAL LOW (ref 26.0–34.0)
MCHC: 32.2 g/dL (ref 32.0–36.0)
MCV: 75.4 fL — ABNORMAL LOW (ref 80.0–100.0)
PLATELETS: 172 10*3/uL (ref 150–440)
RBC: 4.75 MIL/uL (ref 3.80–5.20)
RDW: 15.6 % — AB (ref 11.5–14.5)
WBC: 5.9 10*3/uL (ref 3.6–11.0)

## 2015-07-23 LAB — BASIC METABOLIC PANEL
ANION GAP: 8 (ref 5–15)
BUN: 11 mg/dL (ref 6–20)
CALCIUM: 9.1 mg/dL (ref 8.9–10.3)
CO2: 23 mmol/L (ref 22–32)
Chloride: 107 mmol/L (ref 101–111)
Creatinine, Ser: 0.69 mg/dL (ref 0.44–1.00)
Glucose, Bld: 82 mg/dL (ref 65–99)
POTASSIUM: 3.6 mmol/L (ref 3.5–5.1)
SODIUM: 138 mmol/L (ref 135–145)

## 2015-07-23 LAB — TYPE AND SCREEN
ABO/RH(D): A POS
ANTIBODY SCREEN: NEGATIVE

## 2015-07-23 NOTE — Patient Instructions (Signed)
  Your procedure is scheduled [id number] August 02, 2015. Report to Same Day Surgery. To find out your arrival time please call [phone] between 1PM - 3PM on Thursday 22, 2017.  Remember: Instructions that are not followed completely may result in serious medical risk, up to and including death, or upon the discretion of your surgeon and anesthesiologist your surgery may need to be rescheduled.    _x___ 1. Do not eat food or drink liquids after midnight. No gum chewing or  hard candies.     ____ 2. No Alcohol for 24 hours before or after surgery.   ____ 3. Bring all medications with you on the day of surgery if instructed.    __x__ 4. Notify your doctor if there is any change in your medical condition     (cold, fever, infections).     Do not wear jewelry, make-up, hairpins, clips or nail polish.  Do not wear lotions, powders, or perfumes. You may wear deodorant.  Do not shave 48 hours prior to surgery. Men may shave face and neck.  Do not bring valuables to the hospital.    [facility] is not responsible for any belongings or valuables.               Contacts, dentures or bridgework may not be worn into surgery.  Leave your suitcase in the car. After surgery it may be brought to your room.  For patients admitted to the hospital, discharge time is determined by your treatment team.   Patients discharged the day of surgery will not be allowed to drive home.    Please read over the following fact sheets that you were given:   [facility] Preparing for Surgery  _x___ Take these medicines the morning of surgery with A SIP OF WATER:    1. omeprazole (PRILOSEC)   ____ Fleet Enema (as directed)   ____ Use CHG Soap as directed on instruction sheet  ____ Use inhalers on the day of surgery and bring to hospital day of surgery  ____ Stop metformin 2 days prior to surgery    ____ Take 1/2 of usual insulin dose the night before surgery and none on the morning of  surgery.   ____  Stop Coumadin/Plavix/aspirin on does not apply.  _x___ Stop Anti-inflammatories such as Advil, Aleve, Ibuprofen, Motrin, Naproxen, Naprosyn, Goodies powders or aspirin products.Tylenol OK to take.   ____ Stop supplements until after surgery.    ____ Bring C-Pap to the hospital.

## 2015-07-23 NOTE — Pre-Procedure Instructions (Signed)
EKG: Interpreted by me. Normal sinus rhythm rate 83 bpm, normal PR interval, normal QRS, normal QT interval. Normal axis. Earleen Newport, MD 02/25/15 [phone]

## 2015-07-29 ENCOUNTER — Other Ambulatory Visit: Payer: Self-pay | Admitting: Family Medicine

## 2015-08-02 ENCOUNTER — Encounter: Payer: Self-pay | Admitting: *Deleted

## 2015-08-02 ENCOUNTER — Encounter
Admission: RE | Disposition: A | Discharge status: Home / Self Care | Payer: Self-pay | Source: Ambulatory Visit | Attending: Obstetrics & Gynecology

## 2015-08-02 ENCOUNTER — Ambulatory Visit: Payer: Managed Care, Other (non HMO) | Admitting: Anesthesiology

## 2015-08-02 ENCOUNTER — Ambulatory Visit
Admission: RE | Admit: 2015-08-02 | Discharge: 2015-08-02 | Disposition: A | Discharge status: Home / Self Care | Payer: Managed Care, Other (non HMO) | Source: Ambulatory Visit | Attending: Obstetrics & Gynecology | Admitting: Obstetrics & Gynecology

## 2015-08-02 DIAGNOSIS — N72 Inflammatory disease of cervix uteri: Secondary | ICD-10-CM | POA: Insufficient documentation

## 2015-08-02 DIAGNOSIS — N840 Polyp of corpus uteri: Secondary | ICD-10-CM | POA: Diagnosis not present

## 2015-08-02 DIAGNOSIS — M1990 Unspecified osteoarthritis, unspecified site: Secondary | ICD-10-CM | POA: Insufficient documentation

## 2015-08-02 DIAGNOSIS — K219 Gastro-esophageal reflux disease without esophagitis: Secondary | ICD-10-CM | POA: Diagnosis not present

## 2015-08-02 DIAGNOSIS — E119 Type 2 diabetes mellitus without complications: Secondary | ICD-10-CM | POA: Diagnosis not present

## 2015-08-02 DIAGNOSIS — Z87891 Personal history of nicotine dependence: Secondary | ICD-10-CM | POA: Insufficient documentation

## 2015-08-02 DIAGNOSIS — E282 Polycystic ovarian syndrome: Secondary | ICD-10-CM | POA: Insufficient documentation

## 2015-08-02 DIAGNOSIS — E785 Hyperlipidemia, unspecified: Secondary | ICD-10-CM | POA: Insufficient documentation

## 2015-08-02 DIAGNOSIS — F419 Anxiety disorder, unspecified: Secondary | ICD-10-CM | POA: Diagnosis not present

## 2015-08-02 DIAGNOSIS — N939 Abnormal uterine and vaginal bleeding, unspecified: Secondary | ICD-10-CM | POA: Diagnosis present

## 2015-08-02 DIAGNOSIS — F329 Major depressive disorder, single episode, unspecified: Secondary | ICD-10-CM | POA: Diagnosis not present

## 2015-08-02 HISTORY — PX: INTRAUTERINE DEVICE (IUD) INSERTION: SHX5877

## 2015-08-02 HISTORY — DX: Dizziness and giddiness: R42

## 2015-08-02 HISTORY — PX: HYSTEROSCOPY W/D&C: SHX1775

## 2015-08-02 LAB — POCT PREGNANCY, URINE: Preg Test, Ur: NEGATIVE

## 2015-08-02 LAB — GLUCOSE, CAPILLARY: GLUCOSE-CAPILLARY: 83 mg/dL (ref 65–99)

## 2015-08-02 SURGERY — DILATATION AND CURETTAGE /HYSTEROSCOPY
Anesthesia: General | Wound class: Clean Contaminated

## 2015-08-02 MED ORDER — PROPOFOL 10 MG/ML IV BOLUS
INTRAVENOUS | Status: DC | PRN
  Administered 2015-08-02: 200 mg via INTRAVENOUS
  Administered 2015-08-02: 50 mg via INTRAVENOUS

## 2015-08-02 MED ORDER — FENTANYL CITRATE (PF) 100 MCG/2ML IJ SOLN
INTRAMUSCULAR | Status: AC
  Administered 2015-08-02: 25 ug via INTRAVENOUS
  Filled 2015-08-02: qty 2

## 2015-08-02 MED ORDER — FENTANYL CITRATE (PF) 100 MCG/2ML IJ SOLN
INTRAMUSCULAR | Status: DC | PRN
  Administered 2015-08-02 (×2): 50 ug via INTRAVENOUS

## 2015-08-02 MED ORDER — ONDANSETRON HCL 4 MG/2ML IJ SOLN: 4.0000 mg | Freq: Once | INTRAMUSCULAR | Status: DC | PRN

## 2015-08-02 MED ORDER — MIDAZOLAM HCL 2 MG/2ML IJ SOLN
INTRAMUSCULAR | Status: DC | PRN
  Administered 2015-08-02: 2 mg via INTRAVENOUS

## 2015-08-02 MED ORDER — FENTANYL CITRATE (PF) 100 MCG/2ML IJ SOLN
25.0000 ug | INTRAMUSCULAR | Status: DC | PRN
  Administered 2015-08-02: 25 ug via INTRAVENOUS

## 2015-08-02 MED ORDER — KETOROLAC TROMETHAMINE 30 MG/ML IJ SOLN
INTRAMUSCULAR | Status: AC
  Filled 2015-08-02: qty 10

## 2015-08-02 MED ORDER — LIDOCAINE HCL (PF) 1 % IJ SOLN
INTRAMUSCULAR | Status: AC
  Filled 2015-08-02: qty 30

## 2015-08-02 MED ORDER — SCOPOLAMINE 1 MG/3DAYS TD PT72: 1.0000 | MEDICATED_PATCH | TRANSDERMAL | Status: DC

## 2015-08-02 MED ORDER — ACETAMINOPHEN 325 MG PO TABS: 650.0000 mg | ORAL_TABLET | ORAL | Status: DC | PRN

## 2015-08-02 MED ORDER — ACETAMINOPHEN 650 MG RE SUPP: 650.0000 mg | RECTAL | Status: DC | PRN

## 2015-08-02 MED ORDER — SUGAMMADEX SODIUM 500 MG/5ML IV SOLN
INTRAVENOUS | Status: DC | PRN
  Administered 2015-08-02: 250 mg via INTRAVENOUS

## 2015-08-02 MED ORDER — MORPHINE SULFATE (PF) 2 MG/ML IV SOLN: 1.0000 mg | INTRAVENOUS | Status: DC | PRN

## 2015-08-02 MED ORDER — ROCURONIUM BROMIDE 100 MG/10ML IV SOLN
INTRAVENOUS | Status: DC | PRN
  Administered 2015-08-02: 20 mg via INTRAVENOUS
  Administered 2015-08-02: 10 mg via INTRAVENOUS

## 2015-08-02 MED ORDER — SODIUM CHLORIDE 0.9 % IV SOLN
INTRAVENOUS | Status: DC
  Administered 2015-08-02: 14:00:00 via INTRAVENOUS

## 2015-08-02 MED ORDER — ONDANSETRON HCL 4 MG/2ML IJ SOLN
INTRAMUSCULAR | Status: DC | PRN
  Administered 2015-08-02: 4 mg via INTRAVENOUS

## 2015-08-02 MED ORDER — DEXAMETHASONE SODIUM PHOSPHATE 10 MG/ML IJ SOLN
INTRAMUSCULAR | Status: DC | PRN
Start: 2015-08-02 — End: 2015-08-02
  Administered 2015-08-02: 5 mg via INTRAVENOUS

## 2015-08-02 MED ORDER — SUCCINYLCHOLINE CHLORIDE 20 MG/ML IJ SOLN
INTRAMUSCULAR | Status: DC | PRN
  Administered 2015-08-02: 100 mg via INTRAVENOUS

## 2015-08-02 MED ORDER — LIDOCAINE HCL (CARDIAC) 20 MG/ML IV SOLN
INTRAVENOUS | Status: DC | PRN
  Administered 2015-08-02: 100 mg via INTRAVENOUS

## 2015-08-02 MED ORDER — KETOROLAC TROMETHAMINE 30 MG/ML IJ SOLN
30.0000 mg | Freq: Four times a day (QID) | INTRAMUSCULAR | Status: DC
  Administered 2015-08-02: 30 mg via INTRAVENOUS

## 2015-08-02 SURGICAL SUPPLY — 24 items
CATH ROBINSON RED A/P 16FR (CATHETERS) ×3 IMPLANT
CORD URO TURP 10FT (MISCELLANEOUS) IMPLANT
ELECT REM PT RETURN 9FT ADLT (ELECTROSURGICAL) ×3
ELECT RESECT POWERBALL 24F (MISCELLANEOUS) IMPLANT
ELECTRODE REM PT RTRN 9FT ADLT (ELECTROSURGICAL) ×1 IMPLANT
GLOVE BIOGEL PI IND STRL 6.5 (GLOVE) ×1 IMPLANT
GLOVE BIOGEL PI INDICATOR 6.5 (GLOVE) ×2
GLOVE PI ORTHOPRO 6.5 (GLOVE) ×2
GLOVE PI ORTHOPRO STRL 6.5 (GLOVE) ×1 IMPLANT
GLOVE SURG SYN 6.5 ES PF (GLOVE) ×3 IMPLANT
GLOVE SURG SYN 6.5 PF PI (GLOVE) ×1 IMPLANT
GOWN STRL REUS W/ TWL LRG LVL3 (GOWN DISPOSABLE) ×2 IMPLANT
GOWN STRL REUS W/TWL LRG LVL3 (GOWN DISPOSABLE) ×6
IV LACTATED RINGERS 1000ML (IV SOLUTION) ×3 IMPLANT
KIT RM TURNOVER CYSTO AR (KITS) ×3 IMPLANT
NDL SPNL 22GX3.5 QUINCKE BK (NEEDLE) ×1 IMPLANT
NEEDLE SPNL 22GX3.5 QUINCKE BK (NEEDLE) ×3 IMPLANT
PACK DNC HYST (MISCELLANEOUS) ×3 IMPLANT
PAD OB MATERNITY 4.3X12.25 (PERSONAL CARE ITEMS) ×3 IMPLANT
PAD PREP 24X41 OB/GYN DISP (PERSONAL CARE ITEMS) ×3 IMPLANT
SYRINGE 10CC LL (SYRINGE) ×3 IMPLANT
TOWEL OR 17X26 4PK STRL BLUE (TOWEL DISPOSABLE) ×3 IMPLANT
TUBING CONNECTING 10 (TUBING) ×2 IMPLANT
TUBING CONNECTING 10' (TUBING) ×1

## 2015-08-02 NOTE — H&P (Signed)
H&P Update  PLEASE SEE PAPER H&P  Pt was last seen in my office, and complete history and physical performed.  The surgical history has been reviewed and remains accurate without interval change. The patient was re-examined and patient's physiologic condition has not changed significantly in the last 30 days.  No new pharmacological allergies or types of therapy has been initiated.  Allergies  Allergen Reactions  . Azithromycin Other (See Comments)    Stomach cramping  . Phenergan [Promethazine Hcl] Nausea And Vomiting    Past Medical History  Diagnosis Date  . GERD (gastroesophageal reflux disease)   . Allergy   . Hyperlipidemia   . Urinary incontinence   . PCOS (polycystic ovarian syndrome)   . Insulin resistance   . Pituitary tumor ([place])   . Endometriosis   . Uterine fibroid   . PCOS (polycystic ovarian syndrome)   . Arthritis   . Complication of anesthesia   . PONV (postoperative nausea and vomiting)   . Depression     history  . Diabetes mellitus without complication ([facility])     insulin resistahistory of insulin resistance  . Headache     history of   . History of colonoscopy   . Vertigo    Past Surgical History  Procedure Laterality Date  . Ankle reconstruction  03/2000  . Transphenoidal / transnasal hypophysectomy / resection pituitary tumor  05/1995    Dr Radford Pax  . Uterine laparoscopy  2004, 2006, 2007    BP 153/84 mmHg  Pulse 89  Temp(Src) 98.3 F (36.8 C) (Oral)  Resp 16  Ht 5\' 5"  (1.651 m)  Wt 282 lb (127.914 kg)  BMI 46.93 kg/m2  SpO2 99%  LMP   NAD RRR no murmurs CTAB, no wheezing, resps unlabored +BS, soft, NTTP No c/c/e Pelvic exam deferred  The above history was confirmed with the patient. The condition still exists that makes this procedure necessary. Surgical plan includes D&C  as confirmed on the consent. The treatment plan remains the same, without new options for care.  The patient understands the potential benefits and risks and the  consents have been signed and placed on the chart.     Larey Days, MD Attending Obstetrician Gynecologist [facility]

## 2015-08-02 NOTE — Discharge Instructions (Signed)
You should expect to have some cramping and vaginal bleeding for about a week. This should taper off and subside, much like a period. If heavy bleeding continues or gets worse, you should contact the office for an earlier appointment.   Ibuprofen and a heating pad will be most helpful for the first day or so.  Please call the office or physician on call for fever >101, severe pain, and heavy bleeding.   [place]!!  AMBULATORY SURGERY  DISCHARGE INSTRUCTIONS   1) The drugs that you were given will stay in your system until tomorrow so for the next 24 hours you should not:  A) Drive an automobile B) Make any legal decisions C) Drink any alcoholic beverage   2) You may resume regular meals tomorrow.  Today it is better to start with liquids and gradually work up to solid foods.  You may eat anything you prefer, but it is better to start with liquids, then soup and crackers, and gradually work up to solid foods.   3) Please notify your doctor immediately if you have any unusual bleeding, trouble breathing, redness and pain at the surgery site, drainage, fever, or pain not relieved by medication.    4) Additional Instructions:        Please contact your physician with any problems or Same Day Surgery at [phone], Monday through Friday 6 am to 4 pm, or [place] at [facility] number at [phone].

## 2015-08-02 NOTE — Anesthesia Postprocedure Evaluation (Signed)
Anesthesia Post Note  Patient: Paula Oliver  Procedure(s) Performed: Procedure(s) (LRB): DILATATION AND CURETTAGE /HYSTEROSCOPY (N/A) INTRAUTERINE DEVICE (IUD) INSERTION (N/A)  Patient location during evaluation: PACU Anesthesia Type: General Level of consciousness: awake and alert and oriented Pain management: pain level controlled Vital Signs Assessment: post-procedure vital signs reviewed and stable Respiratory status: spontaneous breathing Cardiovascular status: blood pressure returned to baseline Anesthetic complications: no    Last Vitals:  Filed Vitals:   08/02/15 1626 08/02/15 1636  BP:  148/80  Pulse: 80 81  Temp:  36.7 C  Resp:      Last Pain:  Filed Vitals:   08/02/15 1644  PainSc: 0-No pain                 Parag Dorton,[place]

## 2015-08-02 NOTE — Anesthesia Procedure Notes (Signed)
Procedure Name: Intubation Date/Time: 08/02/2015 3:24 PM Performed by: Aline Brochure Pre-anesthesia Checklist: Patient identified, Emergency Drugs available, Suction available and Patient being monitored Patient Re-evaluated:Patient Re-evaluated prior to inductionOxygen Delivery Method: Circle system utilized Preoxygenation: Pre-oxygenation with 100% oxygen Intubation Type: IV induction and Cricoid Pressure applied Ventilation: Mask ventilation without difficulty Laryngoscope Size: Mac and 3 Grade View: Grade II Tube type: Oral Tube size: 7.0 mm Number of attempts: 1 Airway Equipment and Method: Stylet Placement Confirmation: ETT inserted through vocal cords under direct vision,  positive ETCO2 and breath sounds checked- equal and bilateral Secured at: 21 cm Tube secured with: Tape Dental Injury: Teeth and Oropharynx as per pre-operative assessment

## 2015-08-02 NOTE — Anesthesia Preprocedure Evaluation (Addendum)
Anesthesia Evaluation  Patient identified by MRN, date of birth, ID band Patient awake    Reviewed: Allergy & Precautions, NPO status , Patient's Chart, lab work & pertinent test results  History of Anesthesia Complications (+) PONV and history of anesthetic complications  Airway Mallampati: II  TM Distance: >3 FB     Dental  (+) Caps, Chipped   Pulmonary former smoker,    Pulmonary exam normal        Cardiovascular negative cardio ROS Normal cardiovascular exam     Neuro/Psych  Headaches, Anxiety Depression Hx of Pituitary tumor    GI/Hepatic Neg liver ROS, GERD  Medicated and Controlled,  Endo/Other  diabetes, Well Controlled, Type 2, Oral Hypoglycemic Agents  Renal/GU negative Renal ROS  Female GU complaint     Musculoskeletal  (+) Arthritis , Osteoarthritis,    Abdominal (+) + obese,   Peds negative pediatric ROS (+)  Hematology negative hematology ROS (+)   Anesthesia Other Findings Polycystic ovarian syndrome DM  Reproductive/Obstetrics                            Anesthesia Physical Anesthesia Plan  ASA: III  Anesthesia Plan: General   Post-op Pain Management:    Induction: Intravenous, Rapid sequence and Cricoid pressure planned  Airway Management Planned: Oral ETT  Additional Equipment:   Intra-op Plan:   Post-operative Plan: Extubation in OR  Informed Consent: I have reviewed the patients History and Physical, chart, labs and discussed the procedure including the risks, benefits and alternatives for the proposed anesthesia with the patient or authorized representative who has indicated his/her understanding and acceptance.   Dental advisory given  Plan Discussed with: CRNA and Surgeon  Anesthesia Plan Comments:         Anesthesia Quick Evaluation

## 2015-08-02 NOTE — Transfer of Care (Signed)
Immediate Anesthesia Transfer of Care Note  Patient: Paula Oliver  Procedure(s) Performed: Procedure(s): DILATATION AND CURETTAGE /HYSTEROSCOPY (N/A) INTRAUTERINE DEVICE (IUD) INSERTION (N/A)  Patient Location: PACU  Anesthesia Type:General  Level of Consciousness: awake  Airway & Oxygen Therapy: Patient connected to face mask oxygen  Post-op Assessment: Post -op Vital signs reviewed and stable  Post vital signs: stable  Last Vitals:  Filed Vitals:   08/02/15 1606 08/02/15 1607  BP: 153/78 153/78  Pulse: 92 92  Temp: 36.8 C 36.8 C  Resp: 16     Last Pain:  Filed Vitals:   08/02/15 1608  PainSc: 2          Complications: No apparent anesthesia complications

## 2015-08-02 NOTE — Op Note (Signed)
Operative Report Hysteroscopy, Dilation and Curettage 08/02/2015  Patient:  Paula Oliver  45 y.o. female Preoperative diagnosis:  ABNORMAL UTERINE BLEEDING,THICKENED ENDOMETRIUM,PREGNANCY CONTRACEPTION Postoperative diagnosis:  ABNORMAL UTERINE BLEEDING,THICKENED ENDOMETRIUM,PREGNANCY CONTRACEPTION  PROCEDURE:  Procedure(s): DILATATION AND CURETTAGE /HYSTEROSCOPY (N/A) INTRAUTERINE DEVICE (IUD) INSERTION (N/A) Surgeon:  Surgeon(s) and Role:    * Chelsea Loletha Grayer Ward, MD - Primary  Anesthesia:  MAC/LMA I/O:  800cc crystalloid, minimal blood loss no UOP Specimens:  Endometrial curettings Complications: None Apparent Disposition:  VS stable to PACU  Findings: Uterus, mobile, normal size, sounding to 10 cm; normal cervix, vagina, perineum. Two pedunculated polyps in the uterine cavity and otherwise smooth walled surfaces.  Indication for procedure/Consents: 45 y.o. [id number]  here for scheduled surgery for heavy/prolonged uterine bleeding.  Risks of surgery were discussed with the patient including but not limited to: bleeding which may require transfusion; infection which may require antibiotics; injury to uterus or surrounding organs; intrauterine scarring which may impair future fertility; need for additional procedures including laparotomy or laparoscopy; and other postoperative/anesthesia complications. Written informed consent was obtained.    Procedure Details:   The patient was then taken to the operating room where anesthesia was administered.  After a formal and adequate timeout was performed, she was placed in the dorsal lithotomy position and examined with the above findings. She was then prepped and draped in the sterile manner.  A speculum was then placed in the patient's vagina and a single tooth tenaculum was applied to the anterior lip of the cervix.    The uterus was sounded to 10cm. Her cervix was serially dilated to accommodate the hysteroscope, with findings as above. Polyp  forceps were placed in the uterine cavity and the polyps were grasped and removed at their stalk. A sharp curettage was then performed until there was a gritty texture in all four quadrants. The specimen was handed off to nursing.  The camera was reinserted and confirmed the uterus had been evacuated. The [place] IUD was inserted into the uterus and the IUD was placed in usual fashion. The strings were cut to 3cm. The tenaculum was removed from the anterior lip of the cervix and the vaginal speculum was removed after noting good hemostasis. The patient tolerated the procedure well and was taken to the recovery area awake, extubated and in stable condition.  The patient will be discharged to home as per PACU criteria.  Routine postoperative instructions given. She will follow up in the clinic in two to four weeks for postoperative evaluation.  Larey Days, MD [facility] Attending Gynecologist

## 2015-08-05 ENCOUNTER — Encounter: Payer: Self-pay | Admitting: Obstetrics & Gynecology

## 2015-08-07 LAB — SURGICAL PATHOLOGY

## 2015-08-25 ENCOUNTER — Other Ambulatory Visit: Payer: Self-pay | Admitting: Family Medicine

## 2015-08-27 NOTE — Telephone Encounter (Signed)
Rx called in to requested pharmacy 

## 2015-08-27 NOTE — Telephone Encounter (Signed)
Last f/u 12/2014-CPE

## 2016-01-08 ENCOUNTER — Other Ambulatory Visit: Payer: Self-pay | Admitting: Family Medicine

## 2016-01-08 DIAGNOSIS — Z01419 Encounter for gynecological examination (general) (routine) without abnormal findings: Secondary | ICD-10-CM

## 2016-01-08 DIAGNOSIS — E785 Hyperlipidemia, unspecified: Secondary | ICD-10-CM

## 2016-01-13 ENCOUNTER — Other Ambulatory Visit (INDEPENDENT_AMBULATORY_CARE_PROVIDER_SITE_OTHER): Payer: Managed Care, Other (non HMO)

## 2016-01-13 DIAGNOSIS — Z01419 Encounter for gynecological examination (general) (routine) without abnormal findings: Secondary | ICD-10-CM

## 2016-01-13 NOTE — Addendum Note (Signed)
Addended by: Marchia Bond on: 01/13/2016 08:53 AM   Modules accepted: Orders

## 2016-01-14 LAB — COMPREHENSIVE METABOLIC PANEL
ALBUMIN: 3.6 g/dL (ref 3.5–5.5)
ALK PHOS: 82 IU/L (ref 39–117)
ALT: 16 IU/L (ref 0–32)
AST: 13 IU/L (ref 0–40)
Albumin/Globulin Ratio: 1.4 (ref 1.2–2.2)
BUN / CREAT RATIO: 21 (ref 9–23)
BUN: 13 mg/dL (ref 6–24)
Bilirubin Total: <0.2 mg/dL (ref 0.0–1.2)
CALCIUM: 8.8 mg/dL (ref 8.7–10.2)
CO2: 26 mmol/L (ref 18–29)
CREATININE: 0.62 mg/dL (ref 0.57–1.00)
Chloride: 102 mmol/L (ref 96–106)
GFR calc Af Amer: 126 mL/min/{1.73_m2} (ref >59)
GFR, EST NON AFRICAN AMERICAN: 109 mL/min/{1.73_m2} (ref >59)
GLOBULIN, TOTAL: 2.5 g/dL (ref 1.5–4.5)
GLUCOSE: 106 mg/dL — AB (ref 65–99)
Potassium: 4.2 mmol/L (ref 3.5–5.2)
SODIUM: 142 mmol/L (ref 134–144)
Total Protein: 6.1 g/dL (ref 6.0–8.5)

## 2016-01-14 LAB — CBC WITH DIFFERENTIAL/PLATELET
BASOS ABS: 0 10*3/uL (ref 0.0–0.2)
Basos: 0 %
EOS (ABSOLUTE): 0.1 10*3/uL (ref 0.0–0.4)
EOS: 1 %
HEMATOCRIT: 35.7 % (ref 34.0–46.6)
HEMOGLOBIN: 11.1 g/dL (ref 11.1–15.9)
IMMATURE GRANULOCYTES: 0 %
Immature Grans (Abs): 0 10*3/uL (ref 0.0–0.1)
LYMPHS: 37 %
Lymphocytes Absolute: 2.1 10*3/uL (ref 0.7–3.1)
MCH: 23.8 pg — ABNORMAL LOW (ref 26.6–33.0)
MCHC: 31.1 g/dL — ABNORMAL LOW (ref 31.5–35.7)
MCV: 76 fL — ABNORMAL LOW (ref 79–97)
MONOCYTES: 6 %
Monocytes Absolute: 0.4 10*3/uL (ref 0.1–0.9)
NEUTROS PCT: 56 %
Neutrophils Absolute: 3.1 10*3/uL (ref 1.4–7.0)
Platelets: 183 10*3/uL (ref 150–379)
RBC: 4.67 x10E6/uL (ref 3.77–5.28)
RDW: 17 % — ABNORMAL HIGH (ref 12.3–15.4)
WBC: 5.7 10*3/uL (ref 3.4–10.8)

## 2016-01-14 LAB — LIPID PANEL
CHOL/HDL RATIO: 5.6 ratio — AB (ref 0.0–4.4)
CHOLESTEROL TOTAL: 185 mg/dL (ref 100–199)
HDL: 33 mg/dL — ABNORMAL LOW (ref >39)
LDL CALC: 97 mg/dL (ref 0–99)
TRIGLYCERIDES: 275 mg/dL — AB (ref 0–149)
VLDL CHOLESTEROL CAL: 55 mg/dL — AB (ref 5–40)

## 2016-01-14 LAB — TSH: TSH: 1.32 u[IU]/mL (ref 0.450–4.500)

## 2016-01-20 ENCOUNTER — Encounter: Payer: Self-pay | Admitting: Family Medicine

## 2016-01-20 ENCOUNTER — Ambulatory Visit (INDEPENDENT_AMBULATORY_CARE_PROVIDER_SITE_OTHER): Payer: Managed Care, Other (non HMO) | Admitting: Family Medicine

## 2016-01-20 VITALS — BP 138/78 | HR 77 | Temp 98.1°F | Ht 65.5 in | Wt 260.0 lb

## 2016-01-20 DIAGNOSIS — F418 Other specified anxiety disorders: Secondary | ICD-10-CM | POA: Diagnosis not present

## 2016-01-20 DIAGNOSIS — E7800 Pure hypercholesterolemia, unspecified: Secondary | ICD-10-CM

## 2016-01-20 DIAGNOSIS — Z23 Encounter for immunization: Secondary | ICD-10-CM | POA: Diagnosis not present

## 2016-01-20 DIAGNOSIS — Z01419 Encounter for gynecological examination (general) (routine) without abnormal findings: Secondary | ICD-10-CM

## 2016-01-20 NOTE — Addendum Note (Signed)
Addended by: Pilar Grammes on: 01/20/2016 02:30 PM   Modules accepted: Orders

## 2016-01-20 NOTE — Patient Instructions (Signed)
Great to see you.  Happy birthday and happy holidays!   Keep up the great work!

## 2016-01-20 NOTE — Progress Notes (Signed)
Subjective:   Patient ID: MECO BAZER, female    DOB: 09-29-1970, 45 y.o.   MRN: [id number]  Paula Oliver is a pleasant 45 y.o. year old female who presents to clinic today with Annual Exam (Sees GYN. Pap Smear in April or May was normal.) and Flu Vaccine (will get today)  on 01/20/2016  HPI:  Has GYN- per pt, pap smear was normal in April or May of this year.  Mammogram 03/29/15  H/o pituitary tumor- surgically removed in 05/1995. MRI of brain w  wo contract on 02/07/14 reassuring- no evidence of recurrent tumor.  Has lost 22 pounds with diet alone.  Feels great! Wt Readings from Last 3 Encounters:  01/20/16 260 lb (117.9 kg)  08/02/15 282 lb (127.9 kg)  07/23/15 282 lb (127.9 kg)     Lab Results  Component Value Date   CHOL 185 01/13/2016   HDL 33 (L) 01/13/2016   LDLCALC 97 01/13/2016   LDLDIRECT 142.6 12/25/2013   TRIG 275 (H) 01/13/2016   CHOLHDL 5.6 (H) 01/13/2016   Lab Results  Component Value Date   CREATININE 0.62 01/13/2016   Lab Results  Component Value Date   NA 142 01/13/2016   K 4.2 01/13/2016   CL 102 01/13/2016   CO2 26 01/13/2016   Lab Results  Component Value Date   WBC 5.7 01/13/2016   HGB 11.6 (L) 07/23/2015   HCT 35.7 01/13/2016   MCV 76 (L) 01/13/2016   PLT 183 01/13/2016   Lab Results  Component Value Date   TSH 1.320 01/13/2016    Current Outpatient Prescriptions on File Prior to Visit  Medication Sig Dispense Refill  . ALPRAZolam (XANAX) 0.25 MG tablet TAKE 1 TABLET BY MOUTH AT BEDTIME AS NEEDED FOR ANXIETY 20 tablet 0   No current facility-administered medications on file prior to visit.     Allergies  Allergen Reactions  . Azithromycin Other (See Comments)    Stomach cramping  . Phenergan [Promethazine Hcl] Nausea And Vomiting    Past Medical History:  Diagnosis Date  . Allergy   . Arthritis   . Complication of anesthesia   . Depression    history  . Diabetes mellitus without complication (HCC)    insulin  resistahistory of insulin resistance  . Endometriosis   . GERD (gastroesophageal reflux disease)   . Headache    history of   . History of colonoscopy   . Hyperlipidemia   . Insulin resistance   . PCOS (polycystic ovarian syndrome)   . PCOS (polycystic ovarian syndrome)   . Pituitary tumor   . PONV (postoperative nausea and vomiting)   . Urinary incontinence   . Uterine fibroid   . Vertigo     Past Surgical History:  Procedure Laterality Date  . ANKLE RECONSTRUCTION  03/2000  . HYSTEROSCOPY W/D&C N/A 08/02/2015   Procedure: DILATATION AND CURETTAGE /HYSTEROSCOPY;  Surgeon: Honor Loh Ward, MD;  Location: [facility] ORS;  Service: Gynecology;  Laterality: N/A;  . INTRAUTERINE DEVICE (IUD) INSERTION N/A 08/02/2015   Procedure: INTRAUTERINE DEVICE (IUD) INSERTION;  Surgeon: Honor Loh Ward, MD;  Location: [facility] ORS;  Service: Gynecology;  Laterality: N/A;  . TRANSPHENOIDAL / TRANSNASAL HYPOPHYSECTOMY / RESECTION PITUITARY TUMOR  05/1995   Dr Radford Pax  . uterine laparoscopy  2004, 2006, 2007    Family History  Problem Relation Age of Onset  . Arthritis Mother   . Hyperlipidemia Mother   . Hypertension Mother   . Alcohol abuse Father   .  Drug abuse Father   . Arthritis Father   . Hyperlipidemia Father   . Hypertension Father   . Mental illness Father   . Mental illness Brother   . Arthritis Maternal Grandmother   . Hyperlipidemia Maternal Grandmother   . Hypertension Maternal Grandmother   . Diabetes Maternal Grandmother   . Arthritis Maternal Grandfather   . Hyperlipidemia Maternal Grandfather     Social History   Social History  . Marital status: Married    Spouse name: N/A  . Number of children: N/A  . Years of education: N/A   Occupational History  . Not on file.   Social History Main Topics  . Smoking status: Former Smoker    Years: 2.00    Quit date: 02/10/1999  . Smokeless tobacco: Never Used  . Alcohol use No  . Drug use: No  . Sexual activity: No   Other Topics  Concern  . Not on file   Social History Narrative  . No narrative on file   The PMH, PSH, Social History, Family History, Medications, and allergies have been reviewed in [facility], and have been updated if relevant.   Review of Systems  Constitutional: Negative.   HENT: Negative.   Eyes: Negative.   Respiratory: Negative.   Cardiovascular: Negative.   Gastrointestinal: Negative.   Endocrine: Negative.   Genitourinary: Negative.   Musculoskeletal: Negative.   Allergic/Immunologic: Negative.   Neurological: Negative.   Hematological: Negative.   Psychiatric/Behavioral: Negative.   All other systems reviewed and are negative.      Objective:    BP 138/78 (BP Location: Left Arm, Patient Position: Sitting, Cuff Size: Large)   Pulse 77   Temp 98.1 F (36.7 C) (Oral)   Ht 5' 5.5" (1.664 m)   Wt 260 lb (117.9 kg)   SpO2 97%   BMI 42.61 kg/m    Physical Exam   General:  Well-developed,well-nourished,in no acute distress; alert,appropriate and cooperative throughout examination Head:  normocephalic and atraumatic.   Eyes:  vision grossly intact, PERRL Ears:  R ear normal and L ear normal externally, TMs clear bilaterally Nose:  no external deformity.   Mouth:  good dentition.   Neck:  No deformities, masses, or tenderness noted. Breasts:  No mass, nodules, thickening, tenderness, bulging, retraction, inflamation, nipple discharge or skin changes noted.   Lungs:  Normal respiratory effort, chest expands symmetrically. Lungs are clear to auscultation, no crackles or wheezes. Heart:  Normal rate and regular rhythm. S1 and S2 normal without gallop, murmur, click, rub or other extra sounds. Abdomen:  Bowel sounds positive,abdomen soft and non-tender without masses, organomegaly or hernias noted. Msk:  No deformity or scoliosis noted of thoracic or lumbar spine.   Extremities:  No clubbing, cyanosis, edema, or deformity noted with normal full range of motion of all joints.     Neurologic:  alert & oriented X3 and gait normal.   Skin:  Intact without suspicious lesions or rashes Cervical Nodes:  No lymphadenopathy noted Axillary Nodes:  No palpable lymphadenopathy Psych:  Cognition and judgment appear intact. Alert and cooperative with normal attention span and concentration. No apparent delusions, illusions, hallucinations       Assessment & Plan:   Well woman exam with routine gynecological exam  Pure hypercholesterolemia  Situational anxiety No Follow-up on file.

## 2016-01-20 NOTE — Progress Notes (Signed)
Pre visit review using our clinic review tool, if applicable. No additional management support is needed unless otherwise documented below in the visit note. 

## 2016-01-20 NOTE — Assessment & Plan Note (Signed)
Reviewed preventive care protocols, scheduled due services, and updated immunizations Discussed nutrition, exercise, diet, and healthy lifestyle.  Doing great with diet and exercise.  Encouraged her to keep up the great work.  Influenza vaccine given today.

## 2016-01-20 NOTE — Assessment & Plan Note (Signed)
Improved with diet  

## 2016-05-05 ENCOUNTER — Other Ambulatory Visit: Payer: Self-pay | Admitting: Family Medicine

## 2016-05-05 DIAGNOSIS — Z1231 Encounter for screening mammogram for malignant neoplasm of breast: Secondary | ICD-10-CM

## 2016-05-07 ENCOUNTER — Ambulatory Visit
Admission: RE | Admit: 2016-05-07 | Discharge: 2016-05-07 | Disposition: A | Discharge status: Home / Self Care | Payer: Managed Care, Other (non HMO) | Source: Ambulatory Visit | Attending: Family Medicine | Admitting: Family Medicine

## 2016-05-07 DIAGNOSIS — Z1231 Encounter for screening mammogram for malignant neoplasm of breast: Secondary | ICD-10-CM | POA: Insufficient documentation

## 2016-05-14 ENCOUNTER — Ambulatory Visit
Admission: RE | Admit: 2016-05-14 | Discharge: 2016-05-14 | Disposition: A | Discharge status: Home / Self Care | Payer: Managed Care, Other (non HMO) | Source: Ambulatory Visit | Attending: Obstetrics & Gynecology | Admitting: Obstetrics & Gynecology

## 2016-05-14 ENCOUNTER — Other Ambulatory Visit: Payer: Self-pay | Admitting: Obstetrics & Gynecology

## 2016-05-14 DIAGNOSIS — R933 Abnormal findings on diagnostic imaging of other parts of digestive tract: Secondary | ICD-10-CM | POA: Insufficient documentation

## 2016-05-14 DIAGNOSIS — R102 Pelvic and perineal pain: Secondary | ICD-10-CM | POA: Insufficient documentation

## 2016-05-14 DIAGNOSIS — N83202 Unspecified ovarian cyst, left side: Secondary | ICD-10-CM | POA: Diagnosis not present

## 2016-05-14 MED ORDER — IOPAMIDOL (ISOVUE-300) INJECTION 61%
100.0000 mL | Freq: Once | INTRAVENOUS | Status: AC | PRN
  Administered 2016-05-14: 100 mL via INTRAVENOUS

## 2016-05-15 ENCOUNTER — Ambulatory Visit
Admission: RE | Admit: 2016-05-15 | Payer: Managed Care, Other (non HMO) | Source: Ambulatory Visit | Admitting: Obstetrics & Gynecology

## 2016-05-15 ENCOUNTER — Encounter: Admission: RE | Payer: Self-pay | Source: Ambulatory Visit

## 2016-05-15 SURGERY — LAPAROSCOPY, DIAGNOSTIC
Anesthesia: General

## 2016-10-05 ENCOUNTER — Other Ambulatory Visit: Payer: Self-pay | Admitting: Gastroenterology

## 2016-10-05 DIAGNOSIS — R1032 Left lower quadrant pain: Secondary | ICD-10-CM

## 2016-10-05 DIAGNOSIS — R1031 Right lower quadrant pain: Secondary | ICD-10-CM

## 2016-10-06 ENCOUNTER — Ambulatory Visit
Admission: RE | Admit: 2016-10-06 | Discharge: 2016-10-06 | Disposition: A | Discharge status: Home / Self Care | Payer: Managed Care, Other (non HMO) | Source: Ambulatory Visit | Attending: Gastroenterology | Admitting: Gastroenterology

## 2016-10-06 DIAGNOSIS — K5730 Diverticulosis of large intestine without perforation or abscess without bleeding: Secondary | ICD-10-CM | POA: Diagnosis not present

## 2016-10-06 DIAGNOSIS — R1032 Left lower quadrant pain: Secondary | ICD-10-CM | POA: Diagnosis present

## 2016-10-06 DIAGNOSIS — R1031 Right lower quadrant pain: Secondary | ICD-10-CM | POA: Insufficient documentation

## 2016-10-06 DIAGNOSIS — R161 Splenomegaly, not elsewhere classified: Secondary | ICD-10-CM | POA: Diagnosis not present

## 2016-10-06 MED ORDER — IOPAMIDOL (ISOVUE-300) INJECTION 61%
100.0000 mL | Freq: Once | INTRAVENOUS | Status: AC | PRN
  Administered 2016-10-06: 100 mL via INTRAVENOUS

## 2016-10-07 ENCOUNTER — Encounter: Payer: Self-pay | Admitting: *Deleted

## 2016-10-08 ENCOUNTER — Ambulatory Visit: Payer: Managed Care, Other (non HMO) | Admitting: Anesthesiology

## 2016-10-08 ENCOUNTER — Encounter
Admission: RE | Disposition: A | Discharge status: Home / Self Care | Payer: Self-pay | Source: Ambulatory Visit | Attending: Gastroenterology

## 2016-10-08 ENCOUNTER — Encounter: Payer: Self-pay | Admitting: *Deleted

## 2016-10-08 ENCOUNTER — Ambulatory Visit
Admission: RE | Admit: 2016-10-08 | Discharge: 2016-10-08 | Disposition: A | Discharge status: Home / Self Care | Payer: Managed Care, Other (non HMO) | Source: Ambulatory Visit | Attending: Gastroenterology | Admitting: Gastroenterology

## 2016-10-08 DIAGNOSIS — F329 Major depressive disorder, single episode, unspecified: Secondary | ICD-10-CM | POA: Insufficient documentation

## 2016-10-08 DIAGNOSIS — D127 Benign neoplasm of rectosigmoid junction: Secondary | ICD-10-CM | POA: Insufficient documentation

## 2016-10-08 DIAGNOSIS — Z888 Allergy status to other drugs, medicaments and biological substances status: Secondary | ICD-10-CM | POA: Diagnosis not present

## 2016-10-08 DIAGNOSIS — K219 Gastro-esophageal reflux disease without esophagitis: Secondary | ICD-10-CM | POA: Diagnosis not present

## 2016-10-08 DIAGNOSIS — Z6841 Body Mass Index (BMI) 40.0 and over, adult: Secondary | ICD-10-CM | POA: Diagnosis not present

## 2016-10-08 DIAGNOSIS — F418 Other specified anxiety disorders: Secondary | ICD-10-CM | POA: Insufficient documentation

## 2016-10-08 DIAGNOSIS — Z87891 Personal history of nicotine dependence: Secondary | ICD-10-CM | POA: Diagnosis not present

## 2016-10-08 DIAGNOSIS — E282 Polycystic ovarian syndrome: Secondary | ICD-10-CM | POA: Insufficient documentation

## 2016-10-08 DIAGNOSIS — Z881 Allergy status to other antibiotic agents status: Secondary | ICD-10-CM | POA: Insufficient documentation

## 2016-10-08 DIAGNOSIS — E785 Hyperlipidemia, unspecified: Secondary | ICD-10-CM | POA: Diagnosis not present

## 2016-10-08 DIAGNOSIS — E8881 Metabolic syndrome: Secondary | ICD-10-CM | POA: Insufficient documentation

## 2016-10-08 DIAGNOSIS — K5730 Diverticulosis of large intestine without perforation or abscess without bleeding: Secondary | ICD-10-CM | POA: Insufficient documentation

## 2016-10-08 DIAGNOSIS — E669 Obesity, unspecified: Secondary | ICD-10-CM | POA: Diagnosis not present

## 2016-10-08 DIAGNOSIS — R42 Dizziness and giddiness: Secondary | ICD-10-CM | POA: Insufficient documentation

## 2016-10-08 DIAGNOSIS — K644 Residual hemorrhoidal skin tags: Secondary | ICD-10-CM | POA: Diagnosis not present

## 2016-10-08 DIAGNOSIS — K648 Other hemorrhoids: Secondary | ICD-10-CM | POA: Insufficient documentation

## 2016-10-08 DIAGNOSIS — M1990 Unspecified osteoarthritis, unspecified site: Secondary | ICD-10-CM | POA: Diagnosis not present

## 2016-10-08 DIAGNOSIS — Z79899 Other long term (current) drug therapy: Secondary | ICD-10-CM | POA: Insufficient documentation

## 2016-10-08 DIAGNOSIS — K5792 Diverticulitis of intestine, part unspecified, without perforation or abscess without bleeding: Secondary | ICD-10-CM | POA: Diagnosis present

## 2016-10-08 HISTORY — DX: Anemia, unspecified: D64.9

## 2016-10-08 HISTORY — PX: COLONOSCOPY WITH PROPOFOL: SHX5780

## 2016-10-08 LAB — POCT PREGNANCY, URINE: Preg Test, Ur: NEGATIVE

## 2016-10-08 SURGERY — COLONOSCOPY WITH PROPOFOL
Anesthesia: General

## 2016-10-08 MED ORDER — FENTANYL CITRATE (PF) 100 MCG/2ML IJ SOLN
INTRAMUSCULAR | Status: DC | PRN
  Administered 2016-10-08 (×2): 50 ug via INTRAVENOUS

## 2016-10-08 MED ORDER — LIDOCAINE HCL (PF) 2 % IJ SOLN
INTRAMUSCULAR | Status: AC
  Filled 2016-10-08: qty 2

## 2016-10-08 MED ORDER — MIDAZOLAM HCL 2 MG/2ML IJ SOLN
INTRAMUSCULAR | Status: AC
  Filled 2016-10-08: qty 2

## 2016-10-08 MED ORDER — MIDAZOLAM HCL 2 MG/2ML IJ SOLN
INTRAMUSCULAR | Status: DC | PRN
  Administered 2016-10-08: 2 mg via INTRAVENOUS

## 2016-10-08 MED ORDER — ONDANSETRON HCL 4 MG/2ML IJ SOLN
INTRAMUSCULAR | Status: DC | PRN
  Administered 2016-10-08: 4 mg via INTRAVENOUS

## 2016-10-08 MED ORDER — SODIUM CHLORIDE 0.9 % IV SOLN
INTRAVENOUS | Status: DC
  Administered 2016-10-08: 08:00:00 via INTRAVENOUS

## 2016-10-08 MED ORDER — PROPOFOL 10 MG/ML IV BOLUS
INTRAVENOUS | Status: DC | PRN
  Administered 2016-10-08: 30 mg via INTRAVENOUS
  Administered 2016-10-08: 3 mg via INTRAVENOUS
  Administered 2016-10-08: 30 mg via INTRAVENOUS

## 2016-10-08 MED ORDER — PROPOFOL 500 MG/50ML IV EMUL
INTRAVENOUS | Status: AC
  Filled 2016-10-08: qty 50

## 2016-10-08 MED ORDER — FENTANYL CITRATE (PF) 100 MCG/2ML IJ SOLN
INTRAMUSCULAR | Status: AC
  Filled 2016-10-08: qty 2

## 2016-10-08 MED ORDER — ONDANSETRON HCL 4 MG/2ML IJ SOLN
INTRAMUSCULAR | Status: AC
  Filled 2016-10-08: qty 2

## 2016-10-08 MED ORDER — SODIUM CHLORIDE 0.9 % IV SOLN: INTRAVENOUS | Status: DC

## 2016-10-08 NOTE — Anesthesia Post-op Follow-up Note (Signed)
Anesthesia QCDR form completed.        

## 2016-10-08 NOTE — Anesthesia Postprocedure Evaluation (Signed)
Anesthesia Post Note  Patient: Paula Oliver  Procedure(s) Performed: Procedure(s) (LRB): COLONOSCOPY WITH PROPOFOL (N/A)  Patient location during evaluation: PACU Anesthesia Type: General Level of consciousness: awake Pain management: pain level controlled Vital Signs Assessment: post-procedure vital signs reviewed and stable Respiratory status: spontaneous breathing Cardiovascular status: stable Anesthetic complications: no     Last Vitals:  Vitals:   10/08/16 0830 10/08/16 0840  BP: 127/88   Pulse: 67 64  Resp: 18 12  Temp:    SpO2: 99% 100%    Last Pain:  Vitals:   10/08/16 0706  TempSrc: Tympanic                 VAN STAVEREN,[name]

## 2016-10-08 NOTE — Op Note (Signed)
[facility] Gastroenterology Patient Name: Paula Oliver Procedure Date: 10/08/2016 7:25 AM MRN: [id number] Account #: [id number] Date of Birth: 01-06-1971 Admit Type: Outpatient Age: 46 Room: [facility] ENDO ROOM 1 Gender: Female Note Status: Finalized Procedure:            Colonoscopy Indications:          Follow-up of diverticulitis Providers:            Lollie Sails, MD Referring MD:         Marciano Sequin. Deborra Medina (Referring MD) Medicines:            Monitored Anesthesia Care Complications:        No immediate complications. Procedure:            Pre-Anesthesia Assessment:                       - ASA Grade Assessment: II - A patient with mild                        systemic disease.                       After obtaining informed consent, the colonoscope was                        passed under direct vision. Throughout the procedure,                        the patient's blood pressure, pulse, and oxygen                        saturations were monitored continuously. The                        Colonoscope was introduced through the anus and                        advanced to the the cecum, identified by appendiceal                        orifice and ileocecal valve. The colonoscopy was                        performed without difficulty. The patient tolerated the                        procedure well. The quality of the bowel preparation                        was good. Findings:      Three sessile polyps were found in the recto-sigmoid colon. The polyps       were less than 1 mm in size. These polyps were removed with a cold       biopsy forceps. Resection and retrieval were complete.      Multiple medium-mouthed diverticula were found in the sigmoid colon and       distal descending colon.      The retroflexed view of the distal rectum and anal verge was normal and       showed no anal or rectal abnormalities.      Non-bleeding external and internal hemorrhoids  were  found during       anoscopy. The hemorrhoids were small. Impression:           - Three less than 1 mm polyps at the recto-sigmoid                        colon, removed with a cold biopsy forceps. Resected and                        retrieved.                       - Diverticulosis in the sigmoid colon and in the distal                        descending colon.                       - The distal rectum and anal verge are normal on                        retroflexion view.                       - Non-bleeding external and internal hemorrhoids. Recommendation:       - Discharge patient to home.                       - Use Citrucel one tablespoon PO daily daily. Procedure Code(s):    --- Professional ---                       [phone], Colonoscopy, flexible; with biopsy, single or                        multiple Diagnosis Code(s):    --- Professional ---                       D12.7, Benign neoplasm of rectosigmoid junction                       K64.8, Other hemorrhoids                       K57.32, Diverticulitis of large intestine without                        perforation or abscess without bleeding                       K57.30, Diverticulosis of large intestine without                        perforation or abscess without bleeding CPT copyright 2016 American Medical Association. All rights reserved. The codes documented in this report are preliminary and upon coder review may  be revised to meet current compliance requirements. Lollie Sails, MD 10/08/2016 8:08:56 AM This report has been signed electronically. Number of Addenda: 0 Note Initiated On: 10/08/2016 7:25 AM Scope Withdrawal Time: 0 hours 11 minutes 13 seconds  Total Procedure Duration: 0 hours 16 minutes 11 seconds       [facility]

## 2016-10-08 NOTE — H&P (Signed)
Outpatient short stay form Pre-procedure 10/08/2016 7:31 AM Paula Sails MD  Primary Physician: Dr. Arnette Norris  Reason for visit:  Colonoscopy  History of present illness:  Patient is a 46 year old female presenting today as above. She has a history of an episode of diverticulitis seen also on imaging/CT scan this past April. Since then she has had one repeat episode. She currently has no abdominal pain. She completed her last antibiotic course will over a month ago. She takes no aspirin or blood thinning agents.    Current Facility-Administered Medications:  .  0.9 %  sodium chloride infusion, , Intravenous, Continuous, Paula Sails, MD .  0.9 %  sodium chloride infusion, , Intravenous, Continuous, Paula Sails, MD  Prescriptions Prior to Admission  Medication Sig Dispense Refill Last Dose  . acetaminophen (TYLENOL) 325 MG tablet Take 650 mg by mouth every 6 (six) hours as needed.   Past Week at Unknown time  . ALPRAZolam (XANAX) 0.25 MG tablet TAKE 1 TABLET BY MOUTH AT BEDTIME AS NEEDED FOR ANXIETY 20 tablet 0 Past Month at Unknown time  . ibuprofen (ADVIL,MOTRIN) 200 MG tablet Take 800 mg by mouth every 8 (eight) hours as needed for mild pain or moderate pain.    Past Week at Unknown time  . hydrocortisone (ANUSOL-HC) 2.5 % rectal cream Place 1 application rectally 3 (three) times daily. For 10 days   Not Taking at Unknown time     Allergies  Allergen Reactions  . Azithromycin Other (See Comments)    Stomach cramping  . Phenergan [Promethazine Hcl] Nausea And Vomiting     Past Medical History:  Diagnosis Date  . Allergy   . Anemia   . Arthritis   . Complication of anesthesia   . Depression    history  . Endometriosis   . GERD (gastroesophageal reflux disease)   . Headache    history of   . History of colonoscopy   . Hyperlipidemia   . Insulin resistance   . PCOS (polycystic ovarian syndrome)   . PCOS (polycystic ovarian syndrome)   . Pituitary  tumor   . PONV (postoperative nausea and vomiting)   . Urinary incontinence   . Uterine fibroid   . Vertigo     Review of systems:      Physical Exam    Heart and lungs: Regular rate and rhythm without rub or gallop, lungs are bilaterally clear.    HEENT: Normocephalic atraumatic eyes are anicteric    Other:     Pertinant exam for procedure: Soft nontender nondistended bowel sounds positive normoactive    Planned proceedures: Colonoscopy and indicated procedures. I have discussed the risks benefits and complications of procedures to include not limited to bleeding, infection, perforation and the risk of sedation and the patient wishes to proceed.       Paula Sails, MD Gastroenterology 10/08/2016  7:31 AM

## 2016-10-08 NOTE — Transfer of Care (Signed)
Immediate Anesthesia Transfer of Care Note  Patient: Paula Oliver  Procedure(s) Performed: Procedure(s): COLONOSCOPY WITH PROPOFOL (N/A)  Patient Location: PACU  Anesthesia Type:General  Level of Consciousness: awake  Airway & Oxygen Therapy: Patient Spontanous Breathing and Patient connected to nasal cannula oxygen  Post-op Assessment: Report given to RN and Post -op Vital signs reviewed and stable  Post vital signs: Reviewed  Last Vitals:  Vitals:   10/08/16 0706  BP: (!) 141/90  Pulse: 94  Resp: 20  Temp: (!) 35.8 C  SpO2: 97%    Last Pain:  Vitals:   10/08/16 0706  TempSrc: Tympanic         Complications: No apparent anesthesia complications

## 2016-10-08 NOTE — Anesthesia Preprocedure Evaluation (Signed)
Anesthesia Evaluation  Patient identified by MRN, date of birth, ID band Patient awake    Reviewed: Allergy & Precautions, NPO status , Patient's Chart, lab work & pertinent test results  History of Anesthesia Complications (+) PONV  Airway Mallampati: II       Dental  (+) Teeth Intact   Pulmonary former smoker,     + decreased breath sounds      Cardiovascular Exercise Tolerance: Good  Rhythm:Regular Rate:Normal     Neuro/Psych  Headaches, Anxiety Depression    GI/Hepatic Neg liver ROS, GERD  Medicated,  Endo/Other  negative endocrine ROS  Renal/GU negative Renal ROS     Musculoskeletal   Abdominal (+) + obese,   Peds negative pediatric ROS (+)  Hematology  (+) anemia ,   Anesthesia Other Findings   Reproductive/Obstetrics                             Anesthesia Physical Anesthesia Plan  ASA: II  Anesthesia Plan: General   Post-op Pain Management:    Induction: Intravenous  PONV Risk Score and Plan: 1 and Ondansetron  Airway Management Planned: Natural Airway and Nasal Cannula  Additional Equipment:   Intra-op Plan:   Post-operative Plan:   Informed Consent: I have reviewed the patients History and Physical, chart, labs and discussed the procedure including the risks, benefits and alternatives for the proposed anesthesia with the patient or authorized representative who has indicated his/her understanding and acceptance.     Plan Discussed with: Surgeon  Anesthesia Plan Comments:         Anesthesia Quick Evaluation

## 2016-10-09 ENCOUNTER — Encounter: Payer: Self-pay | Admitting: Gastroenterology

## 2016-10-09 LAB — SURGICAL PATHOLOGY

## 2017-05-04 ENCOUNTER — Telehealth: Payer: Self-pay | Admitting: Family Medicine

## 2017-05-04 ENCOUNTER — Encounter: Payer: Self-pay | Admitting: Family Medicine

## 2017-05-04 ENCOUNTER — Ambulatory Visit: Payer: Managed Care, Other (non HMO) | Admitting: Family Medicine

## 2017-05-04 DIAGNOSIS — M79604 Pain in right leg: Secondary | ICD-10-CM | POA: Insufficient documentation

## 2017-05-04 NOTE — Addendum Note (Signed)
Addended by: Lucille Passy on: 05/04/2017 01:03 PM   Modules accepted: Orders

## 2017-05-04 NOTE — Telephone Encounter (Signed)
Noted/thx dmf 

## 2017-05-04 NOTE — Assessment & Plan Note (Signed)
No known etiology. Will get LE doppler to rule out DVT. The patient indicates understanding of these issues and agrees with the plan.

## 2017-05-04 NOTE — Telephone Encounter (Signed)
It looks like patient has it scheduled for tomorrow, 3/27.

## 2017-05-04 NOTE — Telephone Encounter (Signed)
Copied from [place]. Topic: Quick Communication - See Telephone Encounter >> May 04, 2017 12:52 PM Bea Graff, NT wrote: CRM for notification. See Telephone encounter for: 05/04/17. Pt states that a [place] was suppose to be ordered and scheduled for today and she is checking status on that.

## 2017-05-04 NOTE — Addendum Note (Signed)
Addended by: Marrion Coy on: 05/04/2017 11:29 AM   Modules accepted: Orders

## 2017-05-04 NOTE — Patient Instructions (Signed)
Great to see you. Please stop by to see Monica on your way out. 

## 2017-05-04 NOTE — Progress Notes (Signed)
Subjective:   Patient ID: Paula Oliver, female    DOB: August 19, 1970, 47 y.o.   MRN: [id number]  Paula Oliver is a pleasant 47 y.o. year old female who presents to clinic today with Leg Pain (Patient is here today C/O RLL pain in shin and knee and wraps around to the back of knee.  It is intermittent since 3.22.19 and states "I almost went to the ER because it hurt so bad I was in tears.  Yesterday was a 10 on the pain scale.  Denies any injury.  Worse when walking.  When sitting with leg bent the pain does not go away as quickly as when she props it up.  Occas swelling but no redness.)  on 05/04/2017  HPI:  RLL pain- no known injury. Started 4 days ago.  Pain starts at shin and knee and wraps around to the back of her knee. Intermittent. Yesterday pain was a 10/10. No recent travel. 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 Does not take hormones/OCPs.  Did feel a little short of breath yesterday but she think this was due to the pain yesterday.  Current Outpatient Medications on File Prior to Visit  Medication Sig Dispense Refill  . acetaminophen (TYLENOL) 325 MG tablet Take 650 mg by mouth every 6 (six) hours as needed.    . ALPRAZolam (XANAX) 0.25 MG tablet TAKE 1 TABLET BY MOUTH AT BEDTIME AS NEEDED FOR ANXIETY 20 tablet 0  . ibuprofen (ADVIL,MOTRIN) 200 MG tablet Take 800 mg by mouth every 8 (eight) hours as needed for mild pain or moderate pain.      No current facility-administered medications on file prior to visit.     Allergies  Allergen Reactions  . Azithromycin Other (See Comments)    Stomach cramping  . Phenergan [Promethazine Hcl] Nausea And Vomiting    Past Medical History:  Diagnosis Date  . Allergy   . Anemia   . Arthritis   . Complication of anesthesia   . Depression    history  . Endometriosis   . GERD (gastroesophageal reflux disease)   . Headache    history of   . History of colonoscopy   . Hyperlipidemia   . Insulin resistance   . PCOS (polycystic ovarian  syndrome)   . PCOS (polycystic ovarian syndrome)   . Pituitary tumor   . PONV (postoperative nausea and vomiting)   . Urinary incontinence   . Uterine fibroid   . Vertigo     Past Surgical History:  Procedure Laterality Date  . ANKLE RECONSTRUCTION  03/2000  . COLONOSCOPY WITH PROPOFOL N/A 10/08/2016   Procedure: COLONOSCOPY WITH PROPOFOL;  Surgeon: Lollie Sails, MD;  Location: [facility] ENDOSCOPY;  Service: Endoscopy;  Laterality: N/A;  . DILATION AND CURETTAGE OF UTERUS  07/2005  . HYSTEROSCOPY W/D&C N/A 08/02/2015   Procedure: DILATATION AND CURETTAGE /HYSTEROSCOPY;  Surgeon: Honor Loh Ward, MD;  Location: [facility] ORS;  Service: Gynecology;  Laterality: N/A;  . INTRAUTERINE DEVICE (IUD) INSERTION N/A 08/02/2015   Procedure: INTRAUTERINE DEVICE (IUD) INSERTION;  Surgeon: Honor Loh Ward, MD;  Location: [facility] ORS;  Service: Gynecology;  Laterality: N/A;  . TRANSPHENOIDAL / TRANSNASAL HYPOPHYSECTOMY / RESECTION PITUITARY TUMOR  05/1995   Dr Radford Pax  . uterine laparoscopy  2004, 2006, 2007    Family History  Problem Relation Age of Onset  . Arthritis Mother   . Hyperlipidemia Mother   . Hypertension Mother   . Alcohol abuse Father   . Drug abuse Father   .  Arthritis Father   . Hyperlipidemia Father   . Hypertension Father   . Mental illness Father   . Mental illness Brother   . Arthritis Maternal Grandmother   . Hyperlipidemia Maternal Grandmother   . Hypertension Maternal Grandmother   . Diabetes Maternal Grandmother   . Arthritis Maternal Grandfather   . Hyperlipidemia Maternal Grandfather     Social History   Socioeconomic History  . Marital status: Married    Spouse name: Not on file  . Number of children: Not on file  . Years of education: Not on file  . Highest education level: Not on file  Occupational History  . Not on file  Social Needs  . Financial resource strain: Not on file  . Food insecurity:    Worry: Not on file    Inability: Not on file  .  Transportation needs:    Medical: Not on file    Non-medical: Not on file  Tobacco Use  . Smoking status: Former Smoker    Years: 2.00    Last attempt to quit: 02/10/1999    Years since quitting: 18.2  . Smokeless tobacco: Never Used  Substance and Sexual Activity  . Alcohol use: No    Alcohol/week: 0.0 oz  . Drug use: No  . Sexual activity: Never  Lifestyle  . Physical activity:    Days per week: Not on file    Minutes per session: Not on file  . Stress: Not on file  Relationships  . Social connections:    Talks on phone: Not on file    Gets together: Not on file    Attends religious service: Not on file    Active member of club or organization: Not on file    Attends meetings of clubs or organizations: Not on file    Relationship status: Not on file  . Intimate partner violence:    Fear of current or ex partner: Not on file    Emotionally abused: Not on file    Physically abused: Not on file    Forced sexual activity: Not on file  Other Topics Concern  . Not on file  Social History Narrative  . Not on file   The PMH, PSH, Social History, Family History, Medications, and allergies have been reviewed in [facility], and have been updated if relevant.   Review of Systems  Respiratory: Positive for shortness of breath. Negative for apnea, cough, choking, chest tightness, wheezing and stridor.   Musculoskeletal: Positive for arthralgias and myalgias.  All other systems reviewed and are negative.      Objective:    BP 138/78 (BP Location: Left Arm, Patient Position: Sitting, Cuff Size: Large)   Pulse 88   Temp 98.1 F (36.7 C) (Oral)   Ht 5\' 5"  (1.651 m)   Wt 254 lb 12.8 oz (115.6 kg)   LMP 04/22/2017   SpO2 98%   BMI 42.40 kg/m    Physical Exam  Constitutional: She is oriented to person, place, and time. She appears well-developed and well-nourished. No distress.  HENT:  Head: Normocephalic and atraumatic.  Eyes: Conjunctivae are normal.  Pulmonary/Chest: Effort  normal and breath sounds normal. No respiratory distress.  Musculoskeletal:       Right lower leg: She exhibits tenderness. She exhibits no swelling, no edema, no deformity and no laceration.  Neurological: She is alert and oriented to person, place, and time. No cranial nerve deficit.  Skin: Skin is warm and dry. She is not diaphoretic.  Psychiatric: She has a normal mood and affect. Her behavior is normal. Judgment and thought content normal.  Nursing note reviewed.         Assessment & Plan:   Right leg pain - Plan: DOPPLER VENOUS LEGS BILATERAL, CANCELED: DOPPLER VENOUS ARMS BILATERAL No follow-ups on file.

## 2017-05-05 ENCOUNTER — Ambulatory Visit (INDEPENDENT_AMBULATORY_CARE_PROVIDER_SITE_OTHER): Payer: Managed Care, Other (non HMO)

## 2017-05-05 DIAGNOSIS — M79604 Pain in right leg: Secondary | ICD-10-CM

## 2017-05-25 ENCOUNTER — Ambulatory Visit
Admission: RE | Admit: 2017-05-25 | Discharge: 2017-05-25 | Disposition: A | Discharge status: Home / Self Care | Payer: No Typology Code available for payment source | Source: Ambulatory Visit | Attending: Family Medicine | Admitting: Family Medicine

## 2017-05-25 ENCOUNTER — Other Ambulatory Visit: Payer: Self-pay | Admitting: Family Medicine

## 2017-05-25 DIAGNOSIS — R52 Pain, unspecified: Secondary | ICD-10-CM

## 2017-05-25 DIAGNOSIS — S93401A Sprain of unspecified ligament of right ankle, initial encounter: Secondary | ICD-10-CM | POA: Diagnosis not present

## 2017-05-25 DIAGNOSIS — X58XXXA Exposure to other specified factors, initial encounter: Secondary | ICD-10-CM | POA: Insufficient documentation

## 2017-05-25 DIAGNOSIS — R609 Edema, unspecified: Secondary | ICD-10-CM

## 2017-08-05 ENCOUNTER — Ambulatory Visit: Payer: 59 | Admitting: Psychology

## 2017-08-05 DIAGNOSIS — F332 Major depressive disorder, recurrent severe without psychotic features: Secondary | ICD-10-CM

## 2017-08-13 ENCOUNTER — Ambulatory Visit: Payer: 59 | Admitting: Psychology

## 2017-08-13 DIAGNOSIS — F332 Major depressive disorder, recurrent severe without psychotic features: Secondary | ICD-10-CM | POA: Diagnosis not present

## 2017-08-20 ENCOUNTER — Ambulatory Visit: Payer: 59 | Admitting: Psychology

## 2017-08-20 DIAGNOSIS — F332 Major depressive disorder, recurrent severe without psychotic features: Secondary | ICD-10-CM | POA: Diagnosis not present

## 2017-09-01 ENCOUNTER — Ambulatory Visit: Payer: 59 | Admitting: Psychology

## 2017-09-07 ENCOUNTER — Ambulatory Visit (INDEPENDENT_AMBULATORY_CARE_PROVIDER_SITE_OTHER): Payer: Managed Care, Other (non HMO) | Admitting: Primary Care

## 2017-09-07 ENCOUNTER — Encounter: Payer: Self-pay | Admitting: Primary Care

## 2017-09-07 VITALS — BP 136/84 | HR 88 | Temp 97.9°F | Ht 65.0 in | Wt 268.8 lb

## 2017-09-07 DIAGNOSIS — M79604 Pain in right leg: Secondary | ICD-10-CM

## 2017-09-07 DIAGNOSIS — Z0184 Encounter for antibody response examination: Secondary | ICD-10-CM

## 2017-09-07 DIAGNOSIS — F418 Other specified anxiety disorders: Secondary | ICD-10-CM

## 2017-09-07 DIAGNOSIS — E282 Polycystic ovarian syndrome: Secondary | ICD-10-CM | POA: Diagnosis not present

## 2017-09-07 DIAGNOSIS — G629 Polyneuropathy, unspecified: Secondary | ICD-10-CM

## 2017-09-07 DIAGNOSIS — E7800 Pure hypercholesterolemia, unspecified: Secondary | ICD-10-CM | POA: Diagnosis not present

## 2017-09-07 MED ORDER — ALPRAZOLAM 0.25 MG PO TABS: ORAL_TABLET | ORAL | 0 refills | Status: DC

## 2017-09-07 NOTE — Progress Notes (Signed)
Subjective:    Patient ID: Paula Oliver, female    DOB: May 01, 1970, 47 y.o.   MRN: [id number]  HPI  Paula Oliver is a 47 year old female who presents today to transfer care from Dr. Deborra Oliver. She is due for CPE and will set up for the near future.  1) Hyperlipidemia: Her last lipid panel was in December 2017 with LDL of 97, Trigs of 275, HDL of 33. She has been on cholesterol medication in the past but stopped taking as she couldn't remember to take.   2) Situational Anxiety: Previously managed on Xanax 0.25 mg for which she took sparingly. Her last refill was in July 2017. She takes this once monthly on average for sleep as her mind will race, also for acute anxiety when needed. She denies daily anxiety symptoms.   3) PCOS: Previously managed on Metformin for insulin resistance. Last A1C was 5.6 in 2016. She is currently following with gynecology annually.   4) Hepatitis B: She recently donated blood through the [employer] and tested "reactive" to the Hepatitis B Core Antibodies. Her blood was discarded. She denies nausea, vomiting, fever, body aches.   Review of Systems  Constitutional: Negative for fever.  Respiratory: Negative for shortness of breath.   Cardiovascular: Negative for chest pain.  Gastrointestinal: Negative for nausea and vomiting.  Musculoskeletal: Negative for arthralgias.  Psychiatric/Behavioral:       See HPI       Past Medical History:  Diagnosis Date  . Allergy   . Anemia   . Arthritis   . Complication of anesthesia   . Depression    history  . Endometriosis   . GERD (gastroesophageal reflux disease)   . Headache    history of   . History of colonoscopy   . Hyperlipidemia   . Insulin resistance   . PCOS (polycystic ovarian syndrome)   . PCOS (polycystic ovarian syndrome)   . Pituitary tumor   . PONV (postoperative nausea and vomiting)   . Urinary incontinence   . Uterine fibroid   . Vertigo      Social History   Socioeconomic History    . Marital status: Married    Spouse name: Not on file  . Number of children: Not on file  . Years of education: Not on file  . Highest education level: Not on file  Occupational History  . Not on file  Social Needs  . Financial resource strain: Not on file  . Food insecurity:    Worry: Not on file    Inability: Not on file  . Transportation needs:    Medical: Not on file    Non-medical: Not on file  Tobacco Use  . Smoking status: Former Smoker    Years: 2.00    Last attempt to quit: 02/10/1999    Years since quitting: 18.5  . Smokeless tobacco: Never Used  Substance and Sexual Activity  . Alcohol use: No    Alcohol/week: 0.0 oz  . Drug use: No  . Sexual activity: Never  Lifestyle  . Physical activity:    Days per week: Not on file    Minutes per session: Not on file  . Stress: Not on file  Relationships  . Social connections:    Talks on phone: Not on file    Gets together: Not on file    Attends religious service: Not on file    Active member of club or organization: Not on file  Attends meetings of clubs or organizations: Not on file    Relationship status: Not on file  . Intimate partner violence:    Fear of current or ex partner: Not on file    Emotionally abused: Not on file    Physically abused: Not on file    Forced sexual activity: Not on file  Other Topics Concern  . Not on file  Social History Narrative  . Not on file    Past Surgical History:  Procedure Laterality Date  . ANKLE RECONSTRUCTION  03/2000  . COLONOSCOPY WITH PROPOFOL N/A 10/08/2016   Procedure: COLONOSCOPY WITH PROPOFOL;  Surgeon: Lollie Sails, MD;  Location: [facility] ENDOSCOPY;  Service: Endoscopy;  Laterality: N/A;  . DILATION AND CURETTAGE OF UTERUS  07/2005  . HYSTEROSCOPY W/D&C N/A 08/02/2015   Procedure: DILATATION AND CURETTAGE /HYSTEROSCOPY;  Surgeon: Honor Loh Ward, MD;  Location: [facility] ORS;  Service: Gynecology;  Laterality: N/A;  . INTRAUTERINE DEVICE (IUD) INSERTION N/A  08/02/2015   Procedure: INTRAUTERINE DEVICE (IUD) INSERTION;  Surgeon: Honor Loh Ward, MD;  Location: [facility] ORS;  Service: Gynecology;  Laterality: N/A;  . TRANSPHENOIDAL / TRANSNASAL HYPOPHYSECTOMY / RESECTION PITUITARY TUMOR  05/1995   Dr Radford Pax  . uterine laparoscopy  2004, 2006, 2007    Family History  Problem Relation Age of Onset  . Arthritis Mother   . Hyperlipidemia Mother   . Hypertension Mother   . Alcohol abuse Father   . Drug abuse Father   . Arthritis Father   . Hyperlipidemia Father   . Hypertension Father   . Mental illness Father   . Mental illness Brother   . Arthritis Maternal Grandmother   . Hyperlipidemia Maternal Grandmother   . Hypertension Maternal Grandmother   . Diabetes Maternal Grandmother   . Arthritis Maternal Grandfather   . Hyperlipidemia Maternal Grandfather     Allergies  Allergen Reactions  . Azithromycin Other (See Comments)    Stomach cramping  . Phenergan [Promethazine Hcl] Nausea And Vomiting    Current Outpatient Medications on File Prior to Visit  Medication Sig Dispense Refill  . acetaminophen (TYLENOL) 325 MG tablet Take 650 mg by mouth every 6 (six) hours as needed.    Marland Kitchen ibuprofen (ADVIL,MOTRIN) 200 MG tablet Take 800 mg by mouth every 8 (eight) hours as needed for mild pain or moderate pain.      No current facility-administered medications on file prior to visit.     BP 136/84   Pulse 88   Temp 97.9 F (36.6 C) (Other (Comment))   Ht 5\' 5"  (1.651 m)   Wt 268 lb 12 oz (121.9 kg)   LMP 08/17/2017   SpO2 98%   BMI 44.72 kg/m    Objective:   Physical Exam  Constitutional: She appears well-nourished.  Neck: Neck supple.  Cardiovascular: Normal rate and regular rhythm.  Respiratory: Effort normal and breath sounds normal.  Skin: Skin is warm and dry.  Psychiatric: She has a normal mood and affect.           Assessment & Plan:  Hep B Surface Antigen:  Likely suggests immunity to Hepatitis B. Repeat lab testing  today.  Pleas Koch, NP

## 2017-09-07 NOTE — Assessment & Plan Note (Signed)
Follows with GYN. No recent A1C on file. Will recheck at upcoming CPE.

## 2017-09-07 NOTE — Assessment & Plan Note (Signed)
No recent lipid panel on file, repeat lipid panel pending.

## 2017-09-07 NOTE — Assessment & Plan Note (Signed)
Using Xanax sparingly, has Rx from 2 years ago. Warned about potential addiction and to use sparingly. Refill sent to pharmacy. No suspicious activity on PMP aware. This new Rx should last 1 year.

## 2017-09-07 NOTE — Assessment & Plan Note (Signed)
Resolved

## 2017-09-07 NOTE — Assessment & Plan Note (Signed)
Intermittent numbness/tingling. Increased symptoms with recurrent use of Advil.

## 2017-09-07 NOTE — Patient Instructions (Addendum)
Please schedule a physical with me at your convenience. You may also schedule a lab only appointment 3-4 days prior. We will discuss your lab results in detail during your physical.  Use the alprazolam (Xanax) sparingly as discussed.  It was a pleasure meeting you!

## 2017-09-08 ENCOUNTER — Ambulatory Visit: Payer: 59 | Admitting: Psychology

## 2017-09-08 DIAGNOSIS — F332 Major depressive disorder, recurrent severe without psychotic features: Secondary | ICD-10-CM | POA: Diagnosis not present

## 2017-09-08 LAB — COMPREHENSIVE METABOLIC PANEL
ALT: 23 IU/L (ref 0–32)
AST: 20 IU/L (ref 0–40)
Albumin/Globulin Ratio: 2 (ref 1.2–2.2)
Albumin: 4.1 g/dL (ref 3.5–5.5)
Alkaline Phosphatase: 73 IU/L (ref 39–117)
BILIRUBIN TOTAL: 0.3 mg/dL (ref 0.0–1.2)
BUN / CREAT RATIO: 16 (ref 9–23)
BUN: 11 mg/dL (ref 6–24)
CHLORIDE: 101 mmol/L (ref 96–106)
CO2: 25 mmol/L (ref 20–29)
Calcium: 8.8 mg/dL (ref 8.7–10.2)
Creatinine, Ser: 0.69 mg/dL (ref 0.57–1.00)
GFR calc Af Amer: 121 mL/min/{1.73_m2} (ref >59)
GFR calc non Af Amer: 105 mL/min/{1.73_m2} (ref >59)
GLUCOSE: 101 mg/dL — AB (ref 65–99)
Globulin, Total: 2.1 g/dL (ref 1.5–4.5)
Potassium: 4.4 mmol/L (ref 3.5–5.2)
Sodium: 140 mmol/L (ref 134–144)
Total Protein: 6.2 g/dL (ref 6.0–8.5)

## 2017-09-08 LAB — LIPID PANEL
Chol/HDL Ratio: 5.8 ratio — ABNORMAL HIGH (ref 0.0–4.4)
Cholesterol, Total: 225 mg/dL — ABNORMAL HIGH (ref 100–199)
HDL: 39 mg/dL — AB (ref >39)
LDL Calculated: 111 mg/dL — ABNORMAL HIGH (ref 0–99)
Triglycerides: 374 mg/dL — ABNORMAL HIGH (ref 0–149)
VLDL CHOLESTEROL CAL: 75 mg/dL — AB (ref 5–40)

## 2017-09-08 LAB — HEMOGLOBIN A1C
ESTIMATED AVERAGE GLUCOSE: 97 mg/dL
Hgb A1c MFr Bld: 5 % (ref 4.8–5.6)

## 2017-09-08 LAB — TSH: TSH: 1.29 u[IU]/mL (ref 0.450–4.500)

## 2017-09-08 LAB — HEPATITIS B SURFACE ANTIGEN: Hepatitis B Surface Ag: NEGATIVE

## 2017-09-09 ENCOUNTER — Encounter: Payer: Self-pay | Admitting: Primary Care

## 2017-09-09 DIAGNOSIS — Z862 Personal history of diseases of the blood and blood-forming organs and certain disorders involving the immune mechanism: Secondary | ICD-10-CM

## 2017-09-10 ENCOUNTER — Other Ambulatory Visit (INDEPENDENT_AMBULATORY_CARE_PROVIDER_SITE_OTHER): Payer: Managed Care, Other (non HMO)

## 2017-09-10 DIAGNOSIS — Z862 Personal history of diseases of the blood and blood-forming organs and certain disorders involving the immune mechanism: Secondary | ICD-10-CM

## 2017-09-11 LAB — CBC
HEMOGLOBIN: 12.5 g/dL (ref 11.1–15.9)
Hematocrit: 38.4 % (ref 34.0–46.6)
MCH: 27.5 pg (ref 26.6–33.0)
MCHC: 32.6 g/dL (ref 31.5–35.7)
MCV: 85 fL (ref 79–97)
PLATELETS: 170 10*3/uL (ref 150–450)
RBC: 4.54 x10E6/uL (ref 3.77–5.28)
RDW: 14.6 % (ref 12.3–15.4)
WBC: 6.6 10*3/uL (ref 3.4–10.8)

## 2017-09-11 LAB — IRON AND TIBC
IRON SATURATION: 20 % (ref 15–55)
Iron: 65 ug/dL (ref 27–159)
TIBC: 327 ug/dL (ref 250–450)
UIBC: 262 ug/dL (ref 131–425)

## 2017-09-11 LAB — FERRITIN: Ferritin: 49 ng/mL (ref 15–150)

## 2017-09-20 ENCOUNTER — Ambulatory Visit: Payer: 59 | Admitting: Psychology

## 2017-09-23 ENCOUNTER — Ambulatory Visit (INDEPENDENT_AMBULATORY_CARE_PROVIDER_SITE_OTHER): Payer: Managed Care, Other (non HMO) | Admitting: Primary Care

## 2017-09-23 VITALS — BP 124/84 | HR 81 | Temp 98.2°F | Ht 65.0 in | Wt 269.2 lb

## 2017-09-23 DIAGNOSIS — Z1231 Encounter for screening mammogram for malignant neoplasm of breast: Secondary | ICD-10-CM

## 2017-09-23 DIAGNOSIS — R5383 Other fatigue: Secondary | ICD-10-CM

## 2017-09-23 DIAGNOSIS — E7800 Pure hypercholesterolemia, unspecified: Secondary | ICD-10-CM

## 2017-09-23 DIAGNOSIS — Z1239 Encounter for other screening for malignant neoplasm of breast: Secondary | ICD-10-CM

## 2017-09-23 DIAGNOSIS — Z0000 Encounter for general adult medical examination without abnormal findings: Secondary | ICD-10-CM | POA: Diagnosis not present

## 2017-09-23 NOTE — Assessment & Plan Note (Signed)
Recent metabolic work up unremarkable.  Discussed to work on diet, get regular exercise. Referral placed to pulmonology for sleep study to rule out sleep apnea.

## 2017-09-23 NOTE — Assessment & Plan Note (Signed)
Recent lipid panel with slight elevation. Recommended Fish Oil 1000 mg daily, weight loss through diet and exercise.   Repeat in 6 months.

## 2017-09-23 NOTE — Progress Notes (Signed)
Subjective:    Patient ID: Paula Oliver, female    DOB: 08-07-1970, 47 y.o.   MRN: [id number]  HPI  Paula Oliver is a 47 year old female who presents today for complete physical.  History of vertigo. Wakes up in the morning some days with "dizziness" when feeling tired, intermittent nausea. Also feels fatigued with no energy. She denies congestion, rhinorrhea. She does snore, feels drowsy during the day at work. She finds herself waking up during the night 3-4 times weekly. Symptoms have been present for years. Recent labs unremarkable.   Immunizations: -Tetanus: Completed in 2017 -Influenza: Due this season. Gets at work.    Diet: She endorses a healthy diet.  Breakfast: Yogurt, hard boiled eggs, once weekly fast food Lunch: Left overs, sometimes fast food, [place]  Dinner: Meat, starch, vegetable; fast food, restaurants  Snacks: Granola bars, pickles, olives  Desserts: Twice weekly  Beverages: Water, un-sweet tea, coffee  Exercise: She is not exercising Eye exam: Completed in September 2018 Dental exam: No recent exam Pap Smear: Completed in 2018 Mammogram: Completed in 2018   Review of Systems  Constitutional: Positive for fatigue. Negative for unexpected weight change.  HENT: Negative for rhinorrhea.   Respiratory: Negative for cough and shortness of breath.        Snoring  Cardiovascular: Negative for chest pain.  Gastrointestinal: Negative for constipation and diarrhea.  Genitourinary: Negative for difficulty urinating and menstrual problem.  Musculoskeletal: Negative for arthralgias and myalgias.  Skin: Negative for rash.  Allergic/Immunologic: Negative for environmental allergies.  Neurological: Negative for numbness and headaches.       Intermittent dizziness when feeling tired  Psychiatric/Behavioral: The patient is not nervous/anxious.        Past Medical History:  Diagnosis Date  . Allergy   . Anemia   . Arthritis   . Complication of anesthesia   .  Depression    history  . Endometriosis   . GERD (gastroesophageal reflux disease)   . Headache    history of   . History of colonoscopy   . Hyperlipidemia   . Insulin resistance   . PCOS (polycystic ovarian syndrome)   . PCOS (polycystic ovarian syndrome)   . Pituitary tumor   . PONV (postoperative nausea and vomiting)   . Urinary incontinence   . Uterine fibroid   . Vertigo      Social History   Socioeconomic History  . Marital status: Married    Spouse name: Not on file  . Number of children: Not on file  . Years of education: Not on file  . Highest education level: Not on file  Occupational History  . Not on file  Social Needs  . Financial resource strain: Not on file  . Food insecurity:    Worry: Not on file    Inability: Not on file  . Transportation needs:    Medical: Not on file    Non-medical: Not on file  Tobacco Use  . Smoking status: Former Smoker    Years: 2.00    Last attempt to quit: 02/10/1999    Years since quitting: 18.6  . Smokeless tobacco: Never Used  Substance and Sexual Activity  . Alcohol use: No    Alcohol/week: 0.0 standard drinks  . Drug use: No  . Sexual activity: Never  Lifestyle  . Physical activity:    Days per week: Not on file    Minutes per session: Not on file  . Stress: Not on file  Relationships  . Social connections:    Talks on phone: Not on file    Gets together: Not on file    Attends religious service: Not on file    Active member of club or organization: Not on file    Attends meetings of clubs or organizations: Not on file    Relationship status: Not on file  . Intimate partner violence:    Fear of current or ex partner: Not on file    Emotionally abused: Not on file    Physically abused: Not on file    Forced sexual activity: Not on file  Other Topics Concern  . Not on file  Social History Narrative  . Not on file    Past Surgical History:  Procedure Laterality Date  . ANKLE RECONSTRUCTION  03/2000  .  COLONOSCOPY WITH PROPOFOL N/A 10/08/2016   Procedure: COLONOSCOPY WITH PROPOFOL;  Surgeon: Paula Sails, MD;  Location: [facility] ENDOSCOPY;  Service: Endoscopy;  Laterality: N/A;  . DILATION AND CURETTAGE OF UTERUS  07/2005  . HYSTEROSCOPY W/D&C N/A 08/02/2015   Procedure: DILATATION AND CURETTAGE /HYSTEROSCOPY;  Surgeon: Paula Loh Ward, MD;  Location: [facility] ORS;  Service: Gynecology;  Laterality: N/A;  . INTRAUTERINE DEVICE (IUD) INSERTION N/A 08/02/2015   Procedure: INTRAUTERINE DEVICE (IUD) INSERTION;  Surgeon: Paula Loh Ward, MD;  Location: [facility] ORS;  Service: Gynecology;  Laterality: N/A;  . TRANSPHENOIDAL / TRANSNASAL HYPOPHYSECTOMY / RESECTION PITUITARY TUMOR  05/1995   Paula Oliver  . uterine laparoscopy  2004, 2006, 2007    Family History  Problem Relation Age of Onset  . Arthritis Mother   . Hyperlipidemia Mother   . Hypertension Mother   . Alcohol abuse Father   . Drug abuse Father   . Arthritis Father   . Hyperlipidemia Father   . Hypertension Father   . Mental illness Father   . Mental illness Brother   . Arthritis Maternal Grandmother   . Hyperlipidemia Maternal Grandmother   . Hypertension Maternal Grandmother   . Diabetes Maternal Grandmother   . Arthritis Maternal Grandfather   . Hyperlipidemia Maternal Grandfather     Allergies  Allergen Reactions  . Azithromycin Other (See Comments)    Stomach cramping  . Phenergan [Promethazine Hcl] Nausea And Vomiting    Current Outpatient Medications on File Prior to Visit  Medication Sig Dispense Refill  . acetaminophen (TYLENOL) 325 MG tablet Take 650 mg by mouth every 6 (six) hours as needed.    . ALPRAZolam (XANAX) 0.25 MG tablet Take 1 tablet by mouth once daily as needed for anxiety. 10 tablet 0  . ibuprofen (ADVIL,MOTRIN) 200 MG tablet Take 800 mg by mouth every 8 (eight) hours as needed for mild pain or moderate pain.      No current facility-administered medications on file prior to visit.     BP 124/84    Pulse 81   Temp 98.2 F (36.8 C) (Oral)   Ht 5\' 5"  (1.651 m)   Wt 269 lb 4 oz (122.1 kg)   LMP 09/16/2017   SpO2 98%   BMI 44.81 kg/m    Objective:   Physical Exam  Constitutional: She is oriented to person, place, and time. She appears well-nourished.  HENT:  Mouth/Throat: No oropharyngeal exudate.  Eyes: Pupils are equal, round, and reactive to light. EOM are normal.  Neck: Neck supple. No thyromegaly present.  Cardiovascular: Normal rate and regular rhythm.  Respiratory: Effort normal and breath sounds normal.  GI: Soft. Bowel sounds  are normal. There is no tenderness.  Musculoskeletal: Normal range of motion.  Neurological: She is alert and oriented to person, place, and time.  Skin: Skin is warm and dry.  Psychiatric: She has a normal mood and affect.           Assessment & Plan:

## 2017-09-23 NOTE — Patient Instructions (Signed)
Star taking Fish Oil 1000 mg with a meal for high triglycerides.   It's important to improve your diet by reducing consumption of fast food, fried food, processed snack foods, sugary drinks. Increase consumption of fresh vegetables and fruits, whole grains, water.  Ensure you are drinking 64 ounces of water daily.  Start exercising. You should be getting 150 minutes of moderate intensity exercise weekly.  You will be contacted regarding your referral to pulmonology for sleep study.  Please let us know if you have not been contacted within one week.   Schedule a lab only appointment in 6 months to repeat your cholesterol.  We'll see you in 1 year for your annual exam or sooner if needed.  It was a pleasure to see you today!   Preventive Care 40-64 Years, Female Preventive care refers to lifestyle choices and visits with your health care provider that can promote health and wellness. What does preventive care include?  A yearly physical exam. This is also called an annual well check.  Dental exams once or twice a year.  Routine eye exams. Ask your health care provider how often you should have your eyes checked.  Personal lifestyle choices, including: ? Daily care of your teeth and gums. ? Regular physical activity. ? Eating a healthy diet. ? Avoiding tobacco and drug use. ? Limiting alcohol use. ? Practicing safe sex. ? Taking low-dose aspirin daily starting at age 57. ? Taking vitamin and mineral supplements as recommended by your health care provider. What happens during an annual well check? The services and screenings done by your health care provider during your annual well check will depend on your age, overall health, lifestyle risk factors, and family history of disease. Counseling Your health care provider may ask you questions about your:  Alcohol use.  Tobacco use.  Drug use.  Emotional well-being.  Home and relationship well-being.  Sexual  activity.  Eating habits.  Work and work Statistician.  Method of birth control.  Menstrual cycle.  Pregnancy history.  Screening You may have the following tests or measurements:  Height, weight, and BMI.  Blood pressure.  Lipid and cholesterol levels. These may be checked every 5 years, or more frequently if you are over 48 years old.  Skin check.  Lung cancer screening. You may have this screening every year starting at age 13 if you have a 30-pack-year history of smoking and currently smoke or have quit within the past 15 years.  Fecal occult blood test (FOBT) of the stool. You may have this test every year starting at age 52.  Flexible sigmoidoscopy or colonoscopy. You may have a sigmoidoscopy every 5 years or a colonoscopy every 10 years starting at age 65.  Hepatitis C blood test.  Hepatitis B blood test.  Sexually transmitted disease (STD) testing.  Diabetes screening. This is done by checking your blood sugar (glucose) after you have not eaten for a while (fasting). You may have this done every 1-3 years.  Mammogram. This may be done every 1-2 years. Talk to your health care provider about when you should start having regular mammograms. This may depend on whether you have a family history of breast cancer.  BRCA-related cancer screening. This may be done if you have a family history of breast, ovarian, tubal, or peritoneal cancers.  Pelvic exam and Pap test. This may be done every 3 years starting at age 29. Starting at age 76, this may be done every 5 years if you have  a Pap test in combination with an HPV test.  Bone density scan. This is done to screen for osteoporosis. You may have this scan if you are at high risk for osteoporosis.  Discuss your test results, treatment options, and if necessary, the need for more tests with your health care provider. Vaccines Your health care provider may recommend certain vaccines, such as:  Influenza vaccine. This is  recommended every year.  Tetanus, diphtheria, and acellular pertussis (Tdap, Td) vaccine. You may need a Td booster every 10 years.  Varicella vaccine. You may need this if you have not been vaccinated.  Zoster vaccine. You may need this after age 38.  Measles, mumps, and rubella (MMR) vaccine. You may need at least one dose of MMR if you were born in 1957 or later. You may also need a second dose.  Pneumococcal 13-valent conjugate (PCV13) vaccine. You may need this if you have certain conditions and were not previously vaccinated.  Pneumococcal polysaccharide (PPSV23) vaccine. You may need one or two doses if you smoke cigarettes or if you have certain conditions.  Meningococcal vaccine. You may need this if you have certain conditions.  Hepatitis A vaccine. You may need this if you have certain conditions or if you travel or work in places where you may be exposed to hepatitis A.  Hepatitis B vaccine. You may need this if you have certain conditions or if you travel or work in places where you may be exposed to hepatitis B.  Haemophilus influenzae type b (Hib) vaccine. You may need this if you have certain conditions.  Talk to your health care provider about which screenings and vaccines you need and how often you need them. This information is not intended to replace advice given to you by your health care provider. Make sure you discuss any questions you have with your health care provider. Document Released: 02/22/2015 Document Revised: 10/16/2015 Document Reviewed: 11/27/2014 Elsevier Interactive Patient Education  [employer].

## 2017-09-23 NOTE — Assessment & Plan Note (Signed)
Immunizations UTD. Pap smear UTD. Mammogram due, pending. Discussed the importance of a healthy diet and regular exercise in order for weight loss, and to reduce the risk of any potential medical problems. Exam unremarkable. Labs reviewed. Follow up in 1 year for CPE.

## 2017-10-05 ENCOUNTER — Encounter: Payer: Self-pay | Admitting: Internal Medicine

## 2017-10-05 ENCOUNTER — Ambulatory Visit: Payer: Managed Care, Other (non HMO) | Admitting: Internal Medicine

## 2017-10-05 VITALS — BP 134/84 | HR 88 | Resp 16 | Ht 65.0 in | Wt 266.0 lb

## 2017-10-05 DIAGNOSIS — R102 Pelvic and perineal pain unspecified side: Secondary | ICD-10-CM | POA: Insufficient documentation

## 2017-10-05 DIAGNOSIS — T8332XA Displacement of intrauterine contraceptive device, initial encounter: Secondary | ICD-10-CM | POA: Insufficient documentation

## 2017-10-05 DIAGNOSIS — G4719 Other hypersomnia: Secondary | ICD-10-CM

## 2017-10-05 NOTE — Progress Notes (Signed)
[facility] Consultation      Assessment and Plan:  Excessive daytime sleepiness. -Symptoms and signs of obstructive sleep apnea. -We will send for sleep study.  Obesity, essential hypertension, prediabetes. - Above conditions can be associated with obstructive sleep apnea, therefore treatment of sleep apnea is an important part of their management.  Orders Placed This Encounter  Procedures  . Home sleep test   Return in about 2 months (around 12/05/2017).   Date: 10/05/2017  MRN# [id number] Paula Oliver Dec 17, 1970   Paula Oliver is a 47 y.o. old female seen in consultation for chief complaint of:    Chief Complaint  Patient presents with  . Consult    Referred by Belenda Cruise Clark:pt has excessive daytime sleepiness, snoring, dizziness, apnea.    HPI:   The patient is a 47 year old female referred for symptoms of excessive daytime sleepiness.  Her Epworth is elevated at 16 today She goes to bed at 11 pm, wakes at 7 am, she several times per night, wakes up gasping and coughing. When wakes in am she is tired and is tired much of the the day. On weekends she sleeps until 11 am, she still feels sleepy and takes a nap in the afternoon.  No sleep attacks, denies sleep paralysis.  Denies jaw pain, no TMJ.  Her mother and other family members snore.  Has xanax and takes it for insomnia once a month.    PMHX:   Past Medical History:  Diagnosis Date  . Allergy   . Anemia   . Arthritis   . Complication of anesthesia   . Depression    history  . Endometriosis   . GERD (gastroesophageal reflux disease)   . Headache    history of   . History of colonoscopy   . Hyperlipidemia   . Insulin resistance   . PCOS (polycystic ovarian syndrome)   . PCOS (polycystic ovarian syndrome)   . Pituitary tumor   . PONV (postoperative nausea and vomiting)   . Urinary incontinence   . Uterine fibroid   . Vertigo    Surgical Hx:  Past Surgical History:    Procedure Laterality Date  . ANKLE RECONSTRUCTION  03/2000  . COLONOSCOPY WITH PROPOFOL N/A 10/08/2016   Procedure: COLONOSCOPY WITH PROPOFOL;  Surgeon: Lollie Sails, MD;  Location: [facility] ENDOSCOPY;  Service: Endoscopy;  Laterality: N/A;  . DILATION AND CURETTAGE OF UTERUS  07/2005  . HYSTEROSCOPY W/D&C N/A 08/02/2015   Procedure: DILATATION AND CURETTAGE /HYSTEROSCOPY;  Surgeon: Honor Loh Ward, MD;  Location: [facility] ORS;  Service: Gynecology;  Laterality: N/A;  . INTRAUTERINE DEVICE (IUD) INSERTION N/A 08/02/2015   Procedure: INTRAUTERINE DEVICE (IUD) INSERTION;  Surgeon: Honor Loh Ward, MD;  Location: [facility] ORS;  Service: Gynecology;  Laterality: N/A;  . TRANSPHENOIDAL / TRANSNASAL HYPOPHYSECTOMY / RESECTION PITUITARY TUMOR  05/1995   Dr Radford Pax  . uterine laparoscopy  2004, 2006, 2007   Family Hx:  Family History  Problem Relation Age of Onset  . Arthritis Mother   . Hyperlipidemia Mother   . Hypertension Mother   . Alcohol abuse Father   . Drug abuse Father   . Arthritis Father   . Hyperlipidemia Father   . Hypertension Father   . Mental illness Father   . Mental illness Brother   . Arthritis Maternal Grandmother   . Hyperlipidemia Maternal Grandmother   . Hypertension Maternal Grandmother   . Diabetes Maternal Grandmother   . Arthritis Maternal Grandfather   .  Hyperlipidemia Maternal Grandfather    Social Hx:   Social History   Tobacco Use  . Smoking status: Former Smoker    Years: 2.00    Last attempt to quit: 02/10/1999    Years since quitting: 18.6  . Smokeless tobacco: Never Used  Substance Use Topics  . Alcohol use: No    Alcohol/week: 0.0 standard drinks  . Drug use: No   Medication:    Current Outpatient Medications:  .  acetaminophen (TYLENOL) 325 MG tablet, Take 650 mg by mouth every 6 (six) hours as needed., Disp: , Rfl:  .  ALPRAZolam (XANAX) 0.25 MG tablet, Take 1 tablet by mouth once daily as needed for anxiety., Disp: 10 tablet, Rfl: 0 .  ibuprofen  (ADVIL,MOTRIN) 200 MG tablet, Take 800 mg by mouth every 8 (eight) hours as needed for mild pain or moderate pain. , Disp: , Rfl:    Allergies:  Azithromycin and Phenergan [promethazine hcl]  Review of Systems: Gen:  Denies  fever, sweats, chills HEENT: Denies blurred vision, double vision. bleeds, sore throat Cvc:  No dizziness, chest pain. Resp:   Denies cough or sputum production, shortness of breath Gi: Denies swallowing difficulty, stomach pain. Gu:  Denies bladder incontinence, burning urine Ext:   No Joint pain, stiffness. Skin: No skin rash,  hives  Endoc:  No polyuria, polydipsia. Psych: No depression, insomnia. Other:  All other systems were reviewed with the patient and were negative other that what is mentioned in the HPI.   Physical Examination:   VS: BP 134/84 (BP Location: Left Arm, Cuff Size: Large)   Pulse 88   Resp 16   Ht 5\' 5"  (1.651 m)   Wt 266 lb (120.7 kg)   LMP 09/16/2017   SpO2 97%   BMI 44.26 kg/m   General Appearance: No distress  Neuro:without focal findings,  speech normal,  HEENT: PERRLA, EOM intact.   Pulmonary: normal breath sounds, No wheezing.  CardiovascularNormal S1,S2.  No m/r/g.   Abdomen: Benign, Soft, non-tender. Renal:  No costovertebral tenderness  GU:  No performed at this time. Endoc: No evident thyromegaly, no signs of acromegaly. Skin:   warm, no rashes, no ecchymosis  Extremities: normal, no cyanosis, clubbing.  Other findings:    LABORATORY PANEL:   CBC No results for input(s): WBC, HGB, HCT, PLT in the last 168 hours. ------------------------------------------------------------------------------------------------------------------  Chemistries  No results for input(s): NA, K, CL, CO2, GLUCOSE, BUN, CREATININE, CALCIUM, MG, AST, ALT, ALKPHOS, BILITOT in the last 168 hours.  Invalid input(s):  GFRCGP ------------------------------------------------------------------------------------------------------------------  Cardiac Enzymes No results for input(s): TROPONINI in the last 168 hours. ------------------------------------------------------------  RADIOLOGY:  No results found.     Thank  you for the consultation and for allowing [facility] to assist in the care of your patient. Our recommendations are noted above.  Please contact us if we can be of further service.   Marda Stalker, M.D., F.C.C.P.  Board Certified in Internal Medicine, Pulmonary Medicine, [place], and Sleep Medicine.  [facility] Office Number: [phone]   10/05/2017

## 2017-10-05 NOTE — Patient Instructions (Signed)

## 2017-10-26 ENCOUNTER — Ambulatory Visit: Payer: Self-pay | Admitting: Psychology

## 2017-11-09 DIAGNOSIS — G4733 Obstructive sleep apnea (adult) (pediatric): Secondary | ICD-10-CM | POA: Diagnosis not present

## 2017-11-15 ENCOUNTER — Telehealth: Payer: Self-pay | Admitting: *Deleted

## 2017-11-15 DIAGNOSIS — G4733 Obstructive sleep apnea (adult) (pediatric): Secondary | ICD-10-CM

## 2017-11-15 NOTE — Telephone Encounter (Signed)
LMTCB:  Mild OSA AHI 14 Recommend auto cpap with range of 5-20 cmh20 Weight loss would be beneficial.

## 2017-11-15 NOTE — Telephone Encounter (Signed)
Pt aware of results. Orders placed  Nothing further needed. 

## 2017-12-07 ENCOUNTER — Ambulatory Visit: Payer: Self-pay | Admitting: Internal Medicine

## 2018-01-10 NOTE — Progress Notes (Signed)
*   [facility]     Assessment and Plan:  Obstructive sleep apnea. --Doing well with CPAP, continue every night.  --Will adjust pressure to 8-15. - Flu shot today.  Obesity, essential hypertension, prediabetes. - Above conditions can be associated with obstructive sleep apnea, therefore treatment of sleep apnea is an important part of their management.  Return in about 1 year (around 01/12/2019).   Date: 01/10/2018  MRN# [id number] Paula Oliver 11/04/1970   Paula Oliver is a 47 y.o. old female seen in follow up for chief complaint of  Chief Complaint  Patient presents with  . Follow-up    pt is compliant  on cpap. She has ordered a new cpap mask to try but she feels she has more energy.  . Sleep Apnea     HPI:   The patient is a 48 year old female, last seen with complaints of excessive daytime sleepiness.  She was sent for a home sleep test which showed mild obstructive sleep apnea with an AHI of 14, she was started on CPAP with a pressure range 5-20.  Since her last visit she feels that he has been doing well with CPAP. She is feeling more awake during the day and more energy during the day, she is no longer snoring. She is using it every night for about7 hours per night.   **CPAP download data 12/11/2017-01/09/2018>> raw data personally reviewed.  Usage greater than 4 hours is 22/30 days.  Average usage on days used 6 hours 55 minutes.  Pressure ranges 5-20.  Median pressure 7, 95th percentile pressure 10, maximum pressure 11.  Leaks are within normal limits.  Residual AHI is 1.1.  Overall this shows good compliance with CPAP with excellent control of obstructive sleep apnea when used.  Medication:    Current Outpatient Medications:  .  acetaminophen (TYLENOL) 325 MG tablet, Take 650 mg by mouth every 6 (six) hours as needed., Disp: , Rfl:  .  ALPRAZolam (XANAX) 0.25 MG tablet, Take 1 tablet by mouth once daily as needed for anxiety., Disp: 10 tablet,  Rfl: 0 .  ibuprofen (ADVIL,MOTRIN) 200 MG tablet, Take 800 mg by mouth every 8 (eight) hours as needed for mild pain or moderate pain. , Disp: , Rfl:    Allergies:  Azithromycin and Phenergan [promethazine hcl]      LABORATORY PANEL:   CBC No results for input(s): WBC, HGB, HCT, PLT in the last 168 hours. ------------------------------------------------------------------------------------------------------------------  Chemistries  No results for input(s): NA, K, CL, CO2, GLUCOSE, BUN, CREATININE, CALCIUM, MG, AST, ALT, ALKPHOS, BILITOT in the last 168 hours.  Invalid input(s): GFRCGP ------------------------------------------------------------------------------------------------------------------  Cardiac Enzymes No results for input(s): TROPONINI in the last 168 hours. ------------------------------------------------------------  RADIOLOGY:   No results found for this or any previous visit. No results found for this or any previous visit. ------------------------------------------------------------------------------------------------------------------  Thank  you for allowing [facility] to assist in the care of your patient. Our recommendations are noted above.  Please contact us if we can be of further service.   Marda Stalker, M.D., F.C.C.P.  Board Certified in Internal Medicine, Pulmonary Medicine, [place], and Sleep Medicine.  [facility] Office Number: [phone]  01/10/2018

## 2018-01-11 ENCOUNTER — Ambulatory Visit: Payer: Managed Care, Other (non HMO) | Admitting: Internal Medicine

## 2018-01-11 ENCOUNTER — Encounter: Payer: Self-pay | Admitting: Internal Medicine

## 2018-01-11 VITALS — BP 138/80 | HR 96 | Resp 16 | Ht 65.0 in | Wt 265.0 lb

## 2018-01-11 DIAGNOSIS — Z23 Encounter for immunization: Secondary | ICD-10-CM

## 2018-01-11 DIAGNOSIS — G4733 Obstructive sleep apnea (adult) (pediatric): Secondary | ICD-10-CM

## 2018-01-11 NOTE — Patient Instructions (Addendum)
Will adjust pressure to 8-15 cm H2O.  Flu shot today.

## 2018-01-28 ENCOUNTER — Encounter: Payer: Self-pay | Admitting: Internal Medicine

## 2018-02-24 ENCOUNTER — Other Ambulatory Visit: Payer: Self-pay | Admitting: *Deleted

## 2018-02-24 DIAGNOSIS — G4719 Other hypersomnia: Secondary | ICD-10-CM

## 2018-03-07 ENCOUNTER — Ambulatory Visit: Payer: 59 | Admitting: Psychology

## 2018-03-07 DIAGNOSIS — F332 Major depressive disorder, recurrent severe without psychotic features: Secondary | ICD-10-CM | POA: Diagnosis not present

## 2018-03-23 ENCOUNTER — Other Ambulatory Visit: Payer: Self-pay | Admitting: Primary Care

## 2018-03-23 DIAGNOSIS — E7800 Pure hypercholesterolemia, unspecified: Secondary | ICD-10-CM

## 2018-03-25 ENCOUNTER — Ambulatory Visit: Payer: 59 | Admitting: Psychology

## 2018-03-25 DIAGNOSIS — F332 Major depressive disorder, recurrent severe without psychotic features: Secondary | ICD-10-CM | POA: Diagnosis not present

## 2018-03-29 ENCOUNTER — Other Ambulatory Visit: Payer: Self-pay

## 2018-03-30 ENCOUNTER — Other Ambulatory Visit (INDEPENDENT_AMBULATORY_CARE_PROVIDER_SITE_OTHER): Payer: Managed Care, Other (non HMO)

## 2018-03-30 DIAGNOSIS — E7800 Pure hypercholesterolemia, unspecified: Secondary | ICD-10-CM

## 2018-03-31 LAB — LIPID PANEL
Chol/HDL Ratio: 7.7 ratio — ABNORMAL HIGH (ref 0.0–4.4)
Cholesterol, Total: 247 mg/dL — ABNORMAL HIGH (ref 100–199)
HDL: 32 mg/dL — AB (ref >39)
Triglycerides: 415 mg/dL — ABNORMAL HIGH (ref 0–149)

## 2018-04-14 ENCOUNTER — Ambulatory Visit: Payer: 59 | Admitting: Psychology

## 2018-04-22 ENCOUNTER — Ambulatory Visit: Payer: 59 | Admitting: Psychology

## 2018-04-27 ENCOUNTER — Ambulatory Visit: Payer: 59 | Admitting: Psychology

## 2018-05-05 ENCOUNTER — Ambulatory Visit: Payer: 59 | Admitting: Psychology

## 2018-05-17 ENCOUNTER — Ambulatory Visit (INDEPENDENT_AMBULATORY_CARE_PROVIDER_SITE_OTHER): Payer: 59 | Admitting: Psychology

## 2018-05-17 DIAGNOSIS — F332 Major depressive disorder, recurrent severe without psychotic features: Secondary | ICD-10-CM | POA: Diagnosis not present

## 2018-05-24 ENCOUNTER — Ambulatory Visit: Payer: 59 | Admitting: Psychology

## 2018-06-16 ENCOUNTER — Ambulatory Visit (INDEPENDENT_AMBULATORY_CARE_PROVIDER_SITE_OTHER): Payer: 59 | Admitting: Psychology

## 2018-06-16 DIAGNOSIS — F332 Major depressive disorder, recurrent severe without psychotic features: Secondary | ICD-10-CM | POA: Diagnosis not present

## 2018-06-29 ENCOUNTER — Ambulatory Visit: Payer: 59 | Admitting: Psychology

## 2018-09-05 ENCOUNTER — Other Ambulatory Visit: Payer: Self-pay

## 2018-09-05 ENCOUNTER — Ambulatory Visit
Admission: RE | Admit: 2018-09-05 | Discharge: 2018-09-05 | Disposition: A | Discharge status: Home / Self Care | Payer: Managed Care, Other (non HMO) | Source: Ambulatory Visit | Attending: Primary Care | Admitting: Primary Care

## 2018-09-05 DIAGNOSIS — Z1231 Encounter for screening mammogram for malignant neoplasm of breast: Secondary | ICD-10-CM | POA: Diagnosis present

## 2018-09-05 DIAGNOSIS — Z1239 Encounter for other screening for malignant neoplasm of breast: Secondary | ICD-10-CM

## 2018-09-11 ENCOUNTER — Other Ambulatory Visit: Payer: Self-pay | Admitting: Primary Care

## 2018-09-11 DIAGNOSIS — E7800 Pure hypercholesterolemia, unspecified: Secondary | ICD-10-CM

## 2018-09-22 ENCOUNTER — Telehealth: Payer: Self-pay

## 2018-09-22 NOTE — Telephone Encounter (Signed)
Left detailed VM w COVID screen and back door lab info   

## 2018-09-26 ENCOUNTER — Other Ambulatory Visit: Payer: Self-pay

## 2018-09-26 ENCOUNTER — Other Ambulatory Visit: Payer: Managed Care, Other (non HMO)

## 2018-09-28 ENCOUNTER — Encounter: Payer: Self-pay | Admitting: Primary Care

## 2018-11-03 ENCOUNTER — Other Ambulatory Visit: Payer: Managed Care, Other (non HMO)

## 2018-11-03 DIAGNOSIS — E7800 Pure hypercholesterolemia, unspecified: Secondary | ICD-10-CM

## 2018-11-03 NOTE — Progress Notes (Signed)
Opened in error

## 2018-11-03 NOTE — Addendum Note (Signed)
Addended by: Ellamae Sia on: 11/03/2018 11:54 AM   Modules accepted: Orders

## 2018-11-10 ENCOUNTER — Encounter: Payer: Managed Care, Other (non HMO) | Admitting: Primary Care

## 2018-11-10 DIAGNOSIS — Z0289 Encounter for other administrative examinations: Secondary | ICD-10-CM

## 2018-11-16 ENCOUNTER — Other Ambulatory Visit: Payer: Self-pay | Admitting: Critical Care Medicine

## 2018-11-16 DIAGNOSIS — Z20822 Contact with and (suspected) exposure to covid-19: Secondary | ICD-10-CM

## 2018-11-18 LAB — NOVEL CORONAVIRUS, NAA: SARS-CoV-2, NAA: NOT DETECTED

## 2019-04-28 ENCOUNTER — Other Ambulatory Visit: Payer: Self-pay

## 2019-04-28 ENCOUNTER — Ambulatory Visit: Payer: Self-pay | Attending: Internal Medicine

## 2019-04-28 DIAGNOSIS — Z23 Encounter for immunization: Secondary | ICD-10-CM

## 2019-04-28 NOTE — Progress Notes (Signed)
   Covid-19 Vaccination Clinic  Name:  Paula Oliver    MRN: [id number] DOB: [birth year]  04/28/2019  [name] was observed post Covid-19 immunization for 15 minutes without incident. She was provided with Vaccine Information Sheet and instruction to access the V-Safe system.   [name] was instructed to call 911 with any severe reactions post vaccine: Marland Kitchen Difficulty breathing  . Swelling of face and throat  . A fast heartbeat  . A bad rash all over body  . Dizziness and weakness   Immunizations Administered    Name Date Dose VIS Date Route   Pfizer COVID-19 Vaccine 04/28/2019  9:26 AM 0.3 mL 01/20/2019 Intramuscular   Manufacturer: [place]   Lot: [id number]   [place]: [id number]

## 2019-05-23 ENCOUNTER — Ambulatory Visit: Payer: Self-pay | Attending: Internal Medicine

## 2019-05-23 DIAGNOSIS — Z23 Encounter for immunization: Secondary | ICD-10-CM

## 2019-05-23 NOTE — Progress Notes (Signed)
   Covid-19 Vaccination Clinic  Name:  Paula Oliver    MRN: [id number] DOB: 11-18-1970  05/23/2019  [name] was observed post Covid-19 immunization for 15 minutes without incident. She was provided with Vaccine Information Sheet and instruction to access the V-Safe system.   [name] was instructed to call 911 with any severe reactions post vaccine: Marland Kitchen Difficulty breathing  . Swelling of face and throat  . A fast heartbeat  . A bad rash all over body  . Dizziness and weakness   Immunizations Administered    Name Date Dose VIS Date Route   Pfizer COVID-19 Vaccine 05/23/2019 10:19 AM 0.3 mL 01/20/2019 Intramuscular   Manufacturer: [place]   Lot: [id number]   [place]: [id number]

## 2019-06-23 IMAGING — CT CT ABD-PELV W/ CM
1 of 3 series · 14 of 32 positions shown, 19 images · IV contrast (APPLIED)
Comparison: 05/14/2016

CLINICAL DATA: Persistent right lower quadrant and left lower
quadrant pain. Constipation.

EXAM:
CT ABDOMEN AND PELVIS WITH CONTRAST
TECHNIQUE: Multidetector CT imaging of the abdomen and pelvis was performed
using the standard protocol following bolus administration of
intravenous contrast.
CONTRAST:  100mL [id number] IOPAMIDOL ([id number]-ZLL) INJECTION 61%

[Series 2: axial st · axial · 0.80mm/px · z∈[-972,-552]mm · 14 of 96 slices shown, 19 images]
[im 6/96  soft-tissue]
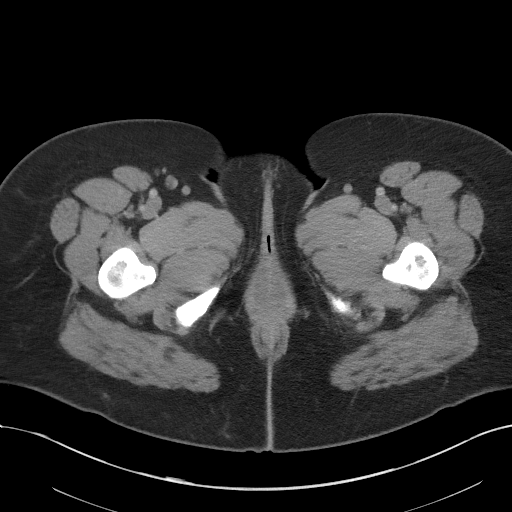
[im 6/96  bone]
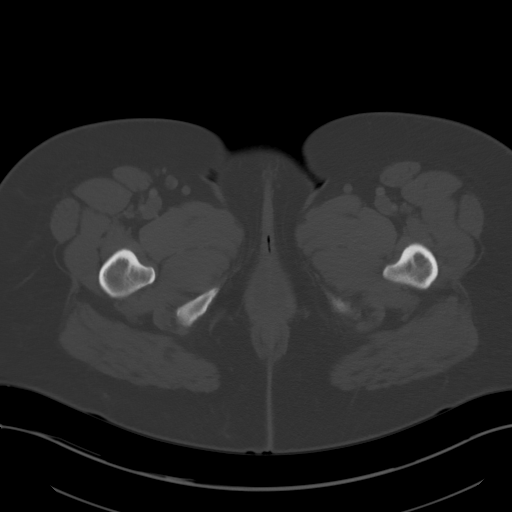
[im 11/96  soft-tissue]
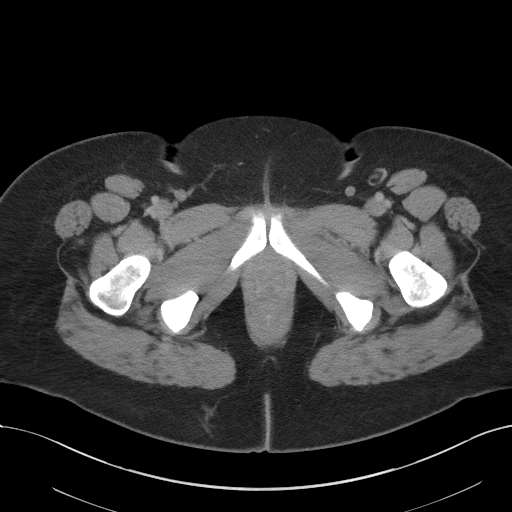
[im 22/96  soft-tissue]
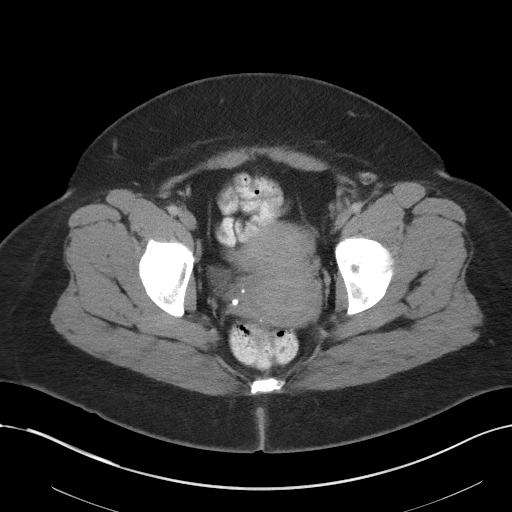
[im 27/96  soft-tissue]
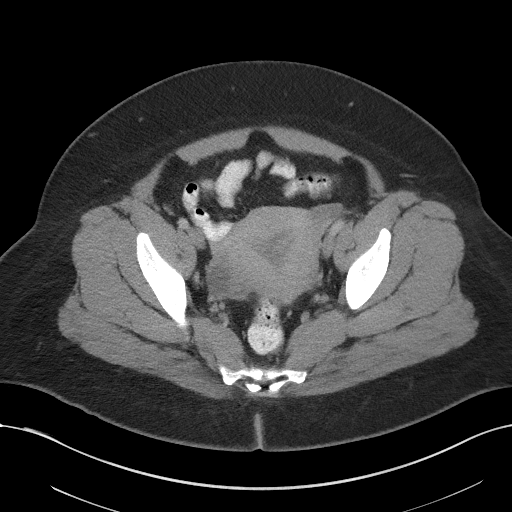
[im 32/96  soft-tissue]
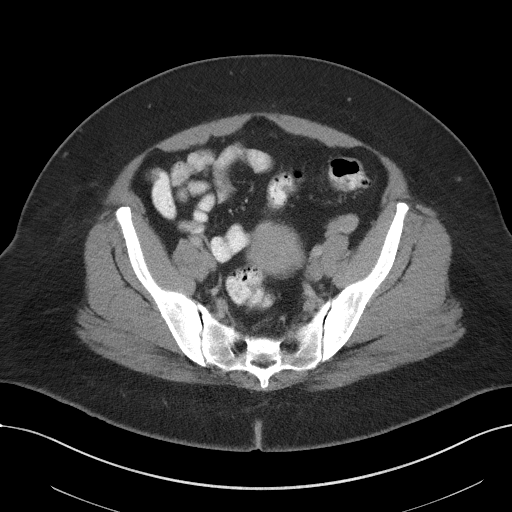
[im 43/96  soft-tissue]
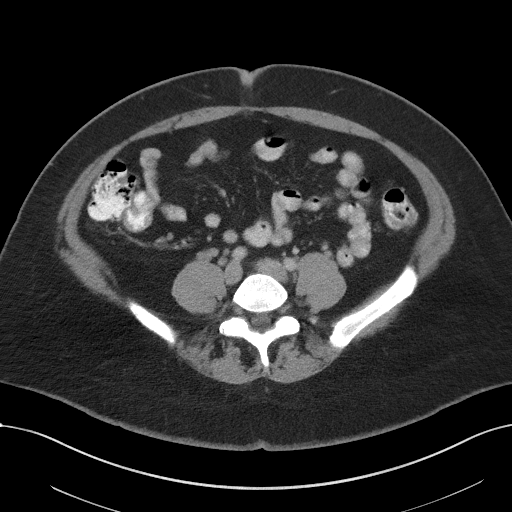
[im 48/96  soft-tissue]
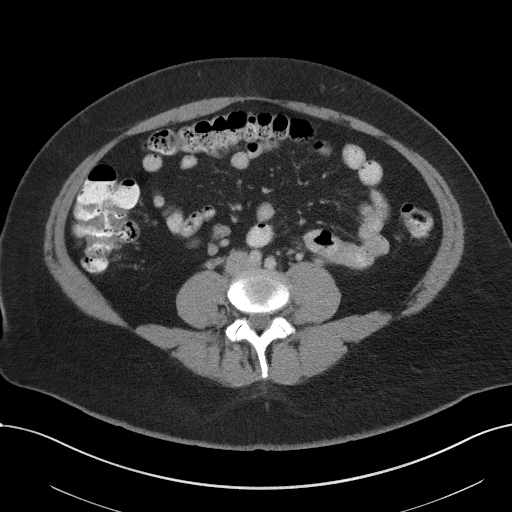
[im 53/96  soft-tissue]
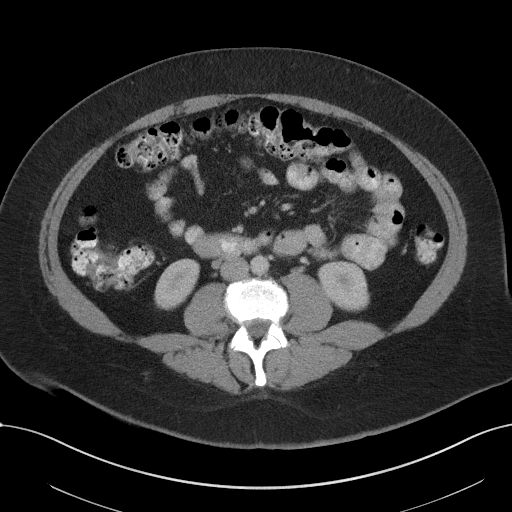
[im 64/96  soft-tissue]
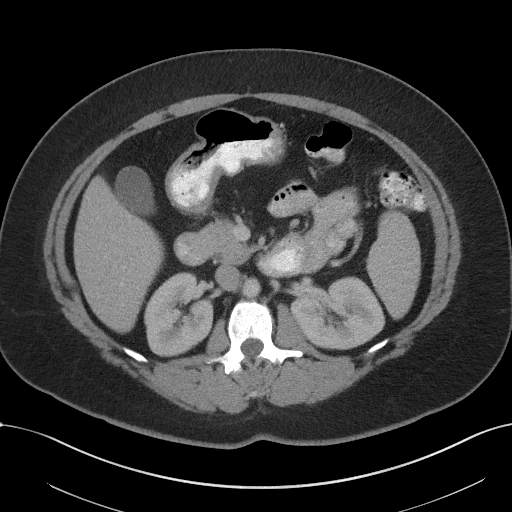
[im 64/96  bone]
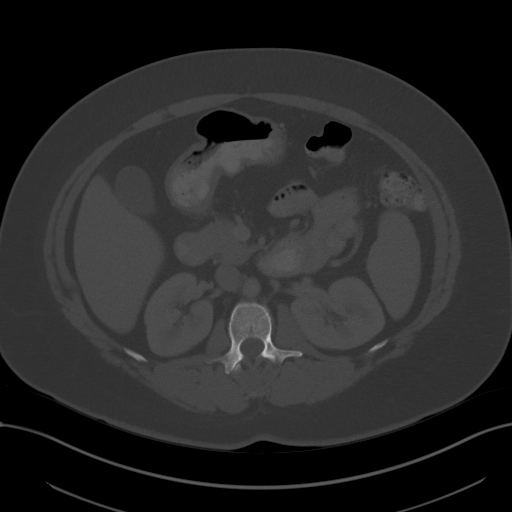
[im 69/96  soft-tissue]
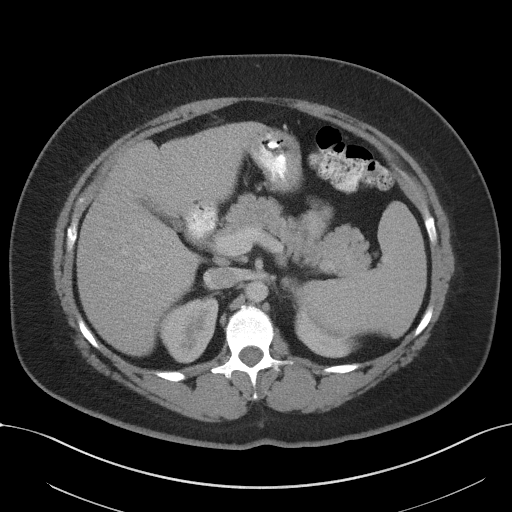
[im 74/96  soft-tissue]
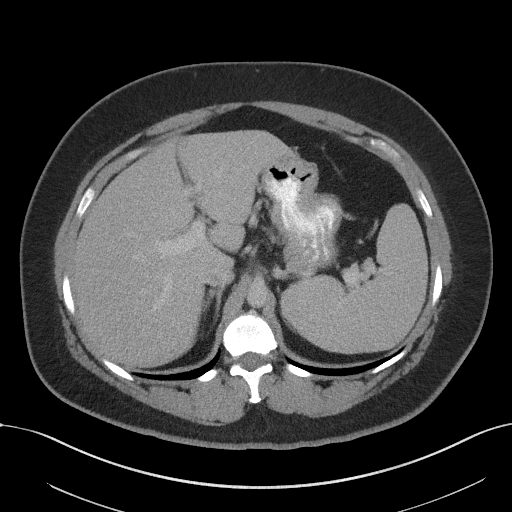
[im 74/96  lung]
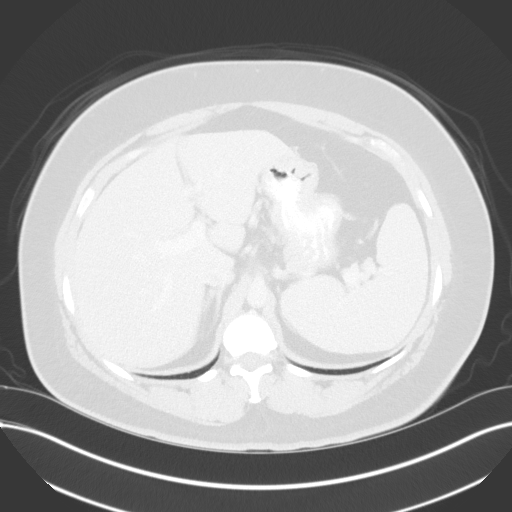
[im 80/96  lung]
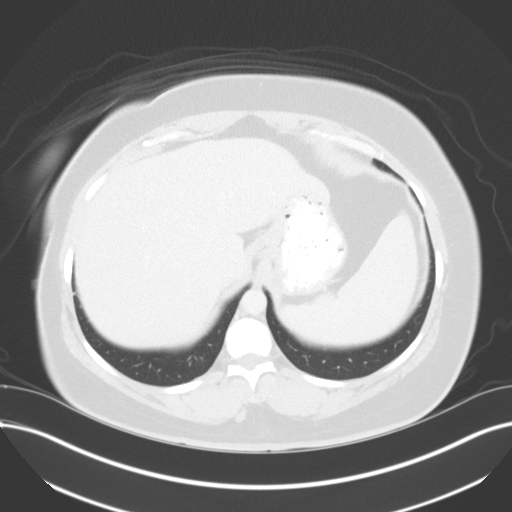
[im 85/96  soft-tissue]
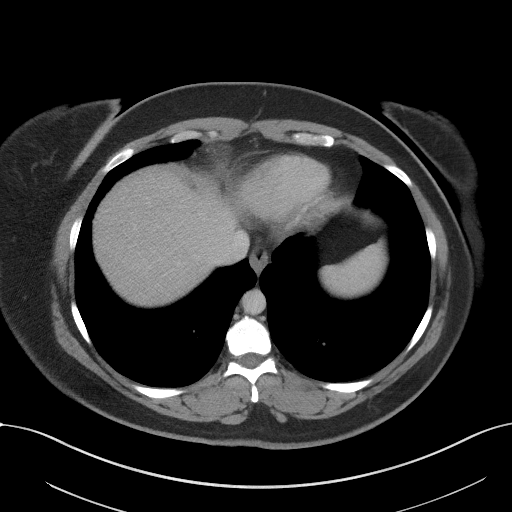
[im 85/96  lung]
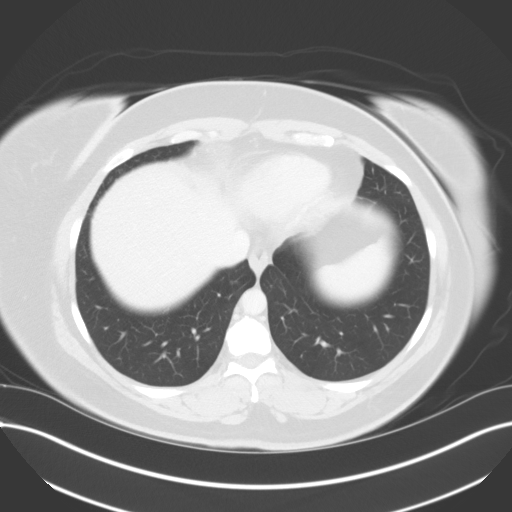
[im 90/96  soft-tissue]
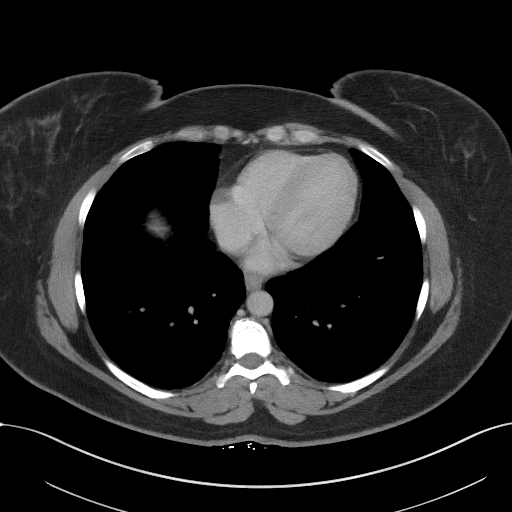
[im 90/96  lung]
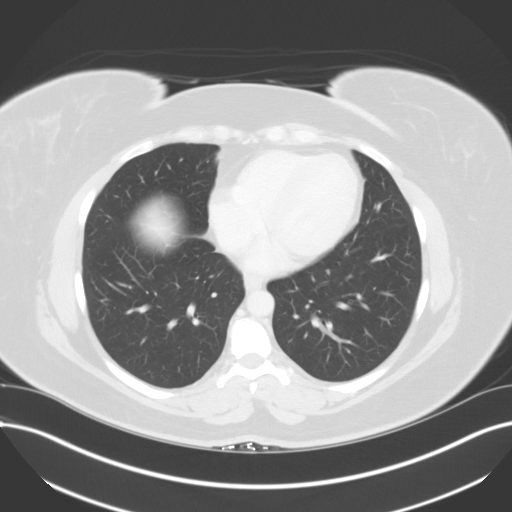

[14 of 32 positions shown; findings below may reference images not displayed]

FINDINGS: Lower chest: No acute abnormality.

Hepatobiliary: No focal liver abnormality is seen. No gallstones,
gallbladder wall thickening, or biliary dilatation.

Pancreas: Unremarkable. No pancreatic ductal dilatation or
surrounding inflammatory changes.

Spleen: The spleen measures 13.3 x 13.2 x 6.4 (volume = 590) cm. No
focal splenic abnormality.

Adrenals/Urinary Tract: The adrenal glands are normal. Normal
appearance of the kidneys. The urinary bladder is collapsed.

Stomach/Bowel: The stomach is normal. The small bowel loops have a
normal course and caliber. Normal appearance of the appendix. Distal
colonic diverticula noted without acute inflammation.

Vascular/Lymphatic: Normal appearance of the abdominal aorta. No
enlarged retroperitoneal or mesenteric adenopathy. No enlarged
pelvic or inguinal lymph nodes.

Reproductive: Uterus and bilateral adnexa are unremarkable.

Other: No abdominal wall hernia or abnormality. No abdominopelvic
ascites.

Musculoskeletal: No acute or significant osseous findings.
IMPRESSION: 1. No acute findings within the abdomen or pelvis. The appendix is
visualized and appears normal.
2. Distal colonic diverticulum without acute inflammation.
3. Splenomegaly.

## 2020-06-05 ENCOUNTER — Ambulatory Visit
Admission: EM | Admit: 2020-06-05 | Discharge: 2020-06-05 | Disposition: A | Discharge status: Home / Self Care | Payer: Managed Care, Other (non HMO)

## 2020-06-05 DIAGNOSIS — I10 Essential (primary) hypertension: Secondary | ICD-10-CM

## 2020-06-05 DIAGNOSIS — J302 Other seasonal allergic rhinitis: Secondary | ICD-10-CM

## 2020-06-05 DIAGNOSIS — Z1152 Encounter for screening for COVID-19: Secondary | ICD-10-CM

## 2020-06-05 DIAGNOSIS — J069 Acute upper respiratory infection, unspecified: Secondary | ICD-10-CM

## 2020-06-05 MED ORDER — BENZONATATE 100 MG PO CAPS: 100.0000 mg | ORAL_CAPSULE | Freq: Three times a day (TID) | ORAL | 0 refills | Status: DC | PRN

## 2020-06-05 NOTE — ED Triage Notes (Signed)
Pt presents with cough, chest congestion, HA x 1 week.  Cough productive for greenish-grayish mucous.  Originally had R ear pain but it has resolved. Cough has worsened HA and caused rib/back pain.  No fevers.  Has used Dayquil, Nyquil and Advil with no relief for cough.

## 2020-06-05 NOTE — Discharge Instructions (Addendum)
Take the Tessalon as directed for cough.  Your COVID test is pending.  You should self quarantine until the test result is back.    Take Tylenol or ibuprofen as needed for fever or discomfort.  Take plain over-the-counter Mucinex as needed for congestion.  Rest and keep yourself hydrated.    Your blood pressure is elevated today at 149/97.  Please have this rechecked by your primary care provider in 2-4 weeks.

## 2020-06-05 NOTE — ED Provider Notes (Signed)
Paula Oliver    CSN: [id number] Arrival date & time: 06/05/20  [id number]      History   Chief Complaint Chief Complaint  Patient presents with  . Cough    HPI Paula Oliver is a 50 y.o. female.   Patient presents with 1 week history of nasal congestion, postnasal drip, runny nose, headache, productive cough.  She states at the onset of her symptoms she had ear pain and sore throat but these have resolved.  She denies fever, chills, rash, shortness of breath, vomiting, diarrhea, or other symptoms.  She has been treating her symptoms at home with Advil, DayQuil, NyQuil.  Her medical history includes allergic rhinitis, hypertension, severe obesity, GERD, PCOS, neuropathy, anemia, pituitary tumor, arthritis, headache, vertigo, depression, anxiety.  The history is provided by the patient and medical records.    Past Medical History:  Diagnosis Date  . Allergy   . Anemia   . Arthritis   . Complication of anesthesia   . Depression    history  . Endometriosis   . GERD (gastroesophageal reflux disease)   . Headache    history of   . History of colonoscopy   . Hyperlipidemia   . Insulin resistance   . PCOS (polycystic ovarian syndrome)   . PCOS (polycystic ovarian syndrome)   . Pituitary tumor   . PONV (postoperative nausea and vomiting)   . Urinary incontinence   . Uterine fibroid   . Vertigo     Patient Active Problem List   Diagnosis Date Noted  . IUD threads lost 10/05/2017  . Pelvic pain in female 10/05/2017  . Fatigue 09/23/2017  . Situational anxiety 01/02/2015  . HLD (hyperlipidemia) 11/28/2013  . PCOS (polycystic ovarian syndrome) 11/28/2013  . Neuropathy 11/28/2013  . Severe obesity (BMI >= 40) ([place]) 11/28/2013  . Preventative health care 11/28/2013  . History of pituitary tumor 11/28/2013  . Menorrhagia 11/28/2013    Past Surgical History:  Procedure Laterality Date  . ANKLE RECONSTRUCTION  03/2000  . COLONOSCOPY WITH PROPOFOL N/A 10/08/2016    Procedure: COLONOSCOPY WITH PROPOFOL;  Surgeon: Lollie Sails, MD;  Location: [facility] ENDOSCOPY;  Service: Endoscopy;  Laterality: N/A;  . DILATION AND CURETTAGE OF UTERUS  07/2005  . HYSTEROSCOPY WITH D & C N/A 08/02/2015   Procedure: DILATATION AND CURETTAGE /HYSTEROSCOPY;  Surgeon: Honor Loh Ward, MD;  Location: [facility] ORS;  Service: Gynecology;  Laterality: N/A;  . INTRAUTERINE DEVICE (IUD) INSERTION N/A 08/02/2015   Procedure: INTRAUTERINE DEVICE (IUD) INSERTION;  Surgeon: Honor Loh Ward, MD;  Location: [facility] ORS;  Service: Gynecology;  Laterality: N/A;  . TRANSPHENOIDAL / TRANSNASAL HYPOPHYSECTOMY / RESECTION PITUITARY TUMOR  05/1995   Dr Radford Pax  . uterine laparoscopy  2004, 2006, 2007    OB History    Gravida  3   Para  1   Term      Preterm      AB  2   Living  1     SAB  2   IAB      Ectopic      Multiple      Live Births               Home Medications    Prior to Admission medications   Medication Sig Start Date End Date Taking? Authorizing Provider  acetaminophen (TYLENOL) 325 MG tablet Take 650 mg by mouth every 6 (six) hours as needed.   Yes [provider]  ALPRAZolam Duanne Moron) 0.25 MG  tablet Take 1 tablet by mouth once daily as needed for anxiety. 09/07/17  Yes Pleas Koch, NP  benzonatate (TESSALON) 100 MG capsule Take 1 capsule (100 mg total) by mouth 3 (three) times daily as needed for cough. 06/05/20  Yes Sharion Balloon, NP  ibuprofen (ADVIL,MOTRIN) 200 MG tablet Take 800 mg by mouth every 8 (eight) hours as needed for mild pain or moderate pain.    Yes [provider]  lansoprazole (PREVACID) 15 MG capsule Take 15 mg by mouth daily at 12 noon.   Yes [provider]    Family History Family History  Problem Relation Age of Onset  . Arthritis Mother   . Hyperlipidemia Mother   . Hypertension Mother   . Alcohol abuse Father   . Drug abuse Father   . Arthritis Father   . Hyperlipidemia Father   . Hypertension  Father   . Mental illness Father   . Mental illness Brother   . Arthritis Maternal Grandmother   . Hyperlipidemia Maternal Grandmother   . Hypertension Maternal Grandmother   . Diabetes Maternal Grandmother   . Arthritis Maternal Grandfather   . Hyperlipidemia Maternal Grandfather     Social History Social History   Tobacco Use  . Smoking status: Former Smoker    Years: 2.00    Quit date: 02/10/1999    Years since quitting: 21.3  . Smokeless tobacco: Never Used  Vaping Use  . Vaping Use: Never used  Substance Use Topics  . Alcohol use: No    Alcohol/week: 0.0 standard drinks  . Drug use: No     Allergies   Azithromycin and Phenergan [promethazine hcl]   Review of Systems Review of Systems  Constitutional: Negative for chills and fever.  HENT: Positive for congestion, postnasal drip and rhinorrhea. Negative for ear pain and sore throat.   Eyes: Negative for pain and visual disturbance.  Respiratory: Positive for cough. Negative for shortness of breath.   Cardiovascular: Negative for chest pain and palpitations.  Gastrointestinal: Negative for abdominal pain, diarrhea and vomiting.  Genitourinary: Negative for dysuria and hematuria.  Musculoskeletal: Negative for arthralgias and back pain.  Skin: Negative for color change and rash.  Neurological: Positive for headaches. Negative for seizures and syncope.  All other systems reviewed and are negative.    Physical Exam Triage Vital Signs ED Triage Vitals  Enc Vitals Group     BP      Pulse      Resp      Temp      Temp src      SpO2      Weight      Height      Head Circumference      Peak Flow      Pain Score      Pain Loc      Pain Edu?      Excl. in [place]?    No data found.  Updated Vital Signs BP (!) 149/97 (BP Location: Right Arm)   Pulse 98   Temp 98.9 F (37.2 C) (Oral)   Resp 20   LMP 05/24/2020   SpO2 96%   Visual Acuity Right Eye Distance:   Left Eye Distance:   Bilateral Distance:     Right Eye Near:   Left Eye Near:    Bilateral Near:     Physical Exam Vitals and nursing note reviewed.  Constitutional:      General: She is not in acute distress.  Appearance: She is well-developed. She is obese. She is not ill-appearing.  HENT:     Head: Normocephalic and atraumatic.     Right Ear: Tympanic membrane normal.     Left Ear: Tympanic membrane normal.     Nose: Rhinorrhea present.     Mouth/Throat:     Mouth: Mucous membranes are moist.     Pharynx: Oropharynx is clear.     Comments: Clear postnasal drip. Eyes:     Conjunctiva/sclera: Conjunctivae normal.  Cardiovascular:     Rate and Rhythm: Normal rate and regular rhythm.     Heart sounds: Normal heart sounds.  Pulmonary:     Effort: Pulmonary effort is normal. No respiratory distress.     Breath sounds: Normal breath sounds.  Abdominal:     Palpations: Abdomen is soft.     Tenderness: There is no abdominal tenderness.  Musculoskeletal:     Cervical back: Neck supple.  Skin:    General: Skin is warm and dry.  Neurological:     General: No focal deficit present.     Mental Status: She is alert and oriented to person, place, and time.  Psychiatric:        Mood and Affect: Mood normal.        Behavior: Behavior normal.      UC Treatments / Results  Labs (all labs ordered are listed, but only abnormal results are displayed) Labs Reviewed  NOVEL CORONAVIRUS, NAA    EKG   Radiology No results found.  Procedures Procedures (including critical care time)  Medications Ordered in UC Medications - No data to display  Initial Impression / Assessment and Plan / UC Course  I have reviewed the triage vital signs and the nursing notes.  Pertinent labs & imaging results that were available during my care of the patient were reviewed by me and considered in my medical decision making (see chart for details).   Viral URI with cough, seasonal allergies.  Elevated blood pressure reading with known  hypertension.  Treating cough with Tessalon Perles.  PCR COVID pending and patient instructed to quarantine per CDC guidelines.  Discussed symptomatic treatment with Tylenol or ibuprofen as needed for fever or discomfort and plain over-the-counter Mucinex as needed for congestion.  Patient cautioned not to use OTC medications that may elevate her blood pressure.  Discussed that her blood pressure is elevated today and needs to be rechecked by her PCP in 2 to 4 weeks.  Education provided on managing hypertension.  Patient agrees to plan of care.   Final Clinical Impressions(s) / UC Diagnoses   Final diagnoses:  Viral URI with cough  Seasonal allergies  Elevated blood pressure reading in office with diagnosis of hypertension     Discharge Instructions     Take the Tessalon as directed for cough.  Your COVID test is pending.  You should self quarantine until the test result is back.    Take Tylenol or ibuprofen as needed for fever or discomfort.  Take plain over-the-counter Mucinex as needed for congestion.  Rest and keep yourself hydrated.    Your blood pressure is elevated today at 149/97.  Please have this rechecked by your primary care provider in 2-4 weeks.           ED Prescriptions    Medication Sig Dispense Auth. Provider   benzonatate (TESSALON) 100 MG capsule Take 1 capsule (100 mg total) by mouth 3 (three) times daily as needed for cough. 21 capsule Barkley Boards  H, NP     PDMP not reviewed this encounter.   Sharion Balloon, NP 06/05/20 [phone]

## 2020-06-06 LAB — SARS-COV-2, NAA 2 DAY TAT

## 2020-06-06 LAB — NOVEL CORONAVIRUS, NAA: SARS-CoV-2, NAA: NOT DETECTED

## 2021-04-03 ENCOUNTER — Other Ambulatory Visit: Payer: Self-pay | Admitting: Internal Medicine

## 2021-04-03 DIAGNOSIS — Z1231 Encounter for screening mammogram for malignant neoplasm of breast: Secondary | ICD-10-CM

## 2021-04-21 ENCOUNTER — Emergency Department: Payer: Managed Care, Other (non HMO)

## 2021-04-21 ENCOUNTER — Emergency Department
Admission: EM | Admit: 2021-04-21 | Discharge: 2021-04-21 | Disposition: A | Discharge status: Home / Self Care | Payer: Managed Care, Other (non HMO) | Attending: Emergency Medicine | Admitting: Emergency Medicine

## 2021-04-21 ENCOUNTER — Encounter: Payer: Self-pay | Admitting: Emergency Medicine

## 2021-04-21 ENCOUNTER — Other Ambulatory Visit: Payer: Self-pay

## 2021-04-21 DIAGNOSIS — K5732 Diverticulitis of large intestine without perforation or abscess without bleeding: Secondary | ICD-10-CM | POA: Insufficient documentation

## 2021-04-21 DIAGNOSIS — R1032 Left lower quadrant pain: Secondary | ICD-10-CM | POA: Diagnosis present

## 2021-04-21 DIAGNOSIS — K5792 Diverticulitis of intestine, part unspecified, without perforation or abscess without bleeding: Secondary | ICD-10-CM

## 2021-04-21 LAB — COMPREHENSIVE METABOLIC PANEL
ALT: 29 U/L (ref 0–44)
AST: 23 U/L (ref 15–41)
Albumin: 4.2 g/dL (ref 3.5–5.0)
Alkaline Phosphatase: 87 U/L (ref 38–126)
Anion gap: 7 (ref 5–15)
BUN: 13 mg/dL (ref 6–20)
CO2: 26 mmol/L (ref 22–32)
Calcium: 9.4 mg/dL (ref 8.9–10.3)
Chloride: 105 mmol/L (ref 98–111)
Creatinine, Ser: 0.84 mg/dL (ref 0.44–1.00)
GFR, Estimated: >60 mL/min (ref >60)
Glucose, Bld: 169 mg/dL — ABNORMAL HIGH (ref 70–99)
Potassium: 3.6 mmol/L (ref 3.5–5.1)
Sodium: 138 mmol/L (ref 135–145)
Total Bilirubin: 0.6 mg/dL (ref 0.3–1.2)
Total Protein: 7.5 g/dL (ref 6.5–8.1)

## 2021-04-21 LAB — CBC
HCT: 40.8 % (ref 36.0–46.0)
Hemoglobin: 13.8 g/dL (ref 12.0–15.0)
MCH: 28.2 pg (ref 26.0–34.0)
MCHC: 33.8 g/dL (ref 30.0–36.0)
MCV: 83.4 fL (ref 80.0–100.0)
Platelets: 222 10*3/uL (ref 150–400)
RBC: 4.89 MIL/uL (ref 3.87–5.11)
RDW: 13.4 % (ref 11.5–15.5)
WBC: 7.9 10*3/uL (ref 4.0–10.5)
nRBC: 0 % (ref 0.0–0.2)

## 2021-04-21 LAB — URINALYSIS, ROUTINE W REFLEX MICROSCOPIC
Bilirubin Urine: NEGATIVE
Glucose, UA: NEGATIVE mg/dL
Hgb urine dipstick: NEGATIVE
Ketones, ur: NEGATIVE mg/dL
Leukocytes,Ua: NEGATIVE
Nitrite: NEGATIVE
Protein, ur: NEGATIVE mg/dL
Specific Gravity, Urine: 1.011 (ref 1.005–1.030)
pH: 6 (ref 5.0–8.0)

## 2021-04-21 LAB — LIPASE, BLOOD: Lipase: 29 U/L (ref 11–51)

## 2021-04-21 LAB — POC URINE PREG, ED: Preg Test, Ur: NEGATIVE

## 2021-04-21 MED ORDER — AMOXICILLIN-POT CLAVULANATE 875-125 MG PO TABS
1.0000 | ORAL_TABLET | Freq: Once | ORAL | Status: AC
  Administered 2021-04-21: 1 via ORAL
  Filled 2021-04-21: qty 1

## 2021-04-21 MED ORDER — KETOROLAC TROMETHAMINE 15 MG/ML IJ SOLN
15.0000 mg | Freq: Once | INTRAMUSCULAR | Status: AC
  Administered 2021-04-21: 15 mg via INTRAVENOUS
  Filled 2021-04-21: qty 1

## 2021-04-21 MED ORDER — AMOXICILLIN-POT CLAVULANATE 875-125 MG PO TABS: 1.0000 | ORAL_TABLET | Freq: Two times a day (BID) | ORAL | 0 refills | Status: AC

## 2021-04-21 MED ORDER — IOHEXOL 300 MG/ML  SOLN
100.0000 mL | Freq: Once | INTRAMUSCULAR | Status: AC | PRN
  Administered 2021-04-21: 100 mL via INTRAVENOUS
  Filled 2021-04-21: qty 100

## 2021-04-21 NOTE — ED Notes (Signed)
Pt to CT

## 2021-04-21 NOTE — Discharge Instructions (Signed)
You have diverticulitis.  Please take the antibiotic twice a day for the next 7 days.  There was also a masslike structure on the right side of your colon which could be just stool but should have follow-up to ensure that it is not a more concerning mass.  Please follow-up with the gastroenterologist. ?
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
## 2021-04-21 NOTE — ED Notes (Signed)
Pt to ED for abdominal pain, cramping since a few days but became sharp and stabbing last night, LLQ. Pain is intermittent, states that pressure applied to mid lower abdomen causes pain on LLQ abdomen. Also has occasional pain on RLQ. ? ?Denies blood in stool, states did have a loose stool this morning that was painful. ?Denies vomiting. Denies urinary symptoms. ? ?Hx diverticulitis and acid reflux. ?
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
## 2021-05-20 ENCOUNTER — Encounter: Payer: Self-pay | Admitting: Radiology

## 2021-05-20 ENCOUNTER — Ambulatory Visit
Admission: RE | Admit: 2021-05-20 | Discharge: 2021-05-20 | Disposition: A | Discharge status: Home / Self Care | Payer: Managed Care, Other (non HMO) | Source: Ambulatory Visit | Attending: Internal Medicine | Admitting: Internal Medicine

## 2021-05-20 DIAGNOSIS — Z1231 Encounter for screening mammogram for malignant neoplasm of breast: Secondary | ICD-10-CM | POA: Diagnosis present

## 2022-06-11 ENCOUNTER — Emergency Department
Admission: EM | Admit: 2022-06-11 | Discharge: 2022-06-11 | Disposition: A | Discharge status: Home / Self Care | Payer: Managed Care, Other (non HMO) | Attending: Emergency Medicine | Admitting: Emergency Medicine

## 2022-06-11 ENCOUNTER — Other Ambulatory Visit: Payer: Self-pay

## 2022-06-11 ENCOUNTER — Emergency Department: Payer: Managed Care, Other (non HMO)

## 2022-06-11 DIAGNOSIS — S93401A Sprain of unspecified ligament of right ankle, initial encounter: Secondary | ICD-10-CM | POA: Diagnosis not present

## 2022-06-11 DIAGNOSIS — S8001XA Contusion of right knee, initial encounter: Secondary | ICD-10-CM | POA: Diagnosis not present

## 2022-06-11 DIAGNOSIS — W19XXXA Unspecified fall, initial encounter: Secondary | ICD-10-CM | POA: Insufficient documentation

## 2022-06-11 DIAGNOSIS — S99911A Unspecified injury of right ankle, initial encounter: Secondary | ICD-10-CM | POA: Diagnosis present

## 2022-06-11 MED ORDER — HYDROCODONE-ACETAMINOPHEN 5-325 MG PO TABS: 1.0000 | ORAL_TABLET | Freq: Four times a day (QID) | ORAL | 0 refills | Status: DC | PRN

## 2022-06-11 NOTE — Discharge Instructions (Signed)
Follow-up with Dr. Joice Lofts who is on-call for orthopedics.  His contact information and address are listed on your discharge papers.  His office is in the [facility].  Ice and elevate to reduce swelling.  Tylenol or ibuprofen as needed.  Also a prescription for hydrocodone was sent to the pharmacy to take if needed for moderate pain or especially if you are trying to sleep.  Take only as directed and do not drive while taking this medication.  Wear the knee immobilizer anytime you are up.  You do not necessarily have to sleep with this alone if it becomes uncomfortable.  You should be seeing improvement in approximately 7 days from this injury.  If not you will need to be reevaluated by the orthopedist and possibly more imaging.

## 2022-06-11 NOTE — ED Provider Notes (Signed)
[facility] Provider Note    Event Date/Time   First MD Initiated Contact with Patient 06/11/22 [phone]     (approximate)   History   Knee Pain   HPI  Paula Oliver is a 52 y.o. female presents to the ED with complaint of right knee and ankle pain.  Patient states that she fell this morning after she missed a step, twisting her ankle and falling directly onto her right knee.  Patient hit concrete and also has a superficial abrasion.  Patient denies injury to her head or loss of consciousness.     Physical Exam   Triage Vital Signs: ED Triage Vitals  Enc Vitals Group     BP 06/11/22 0813 (!) 158/86     Pulse Rate 06/11/22 0813 97     Resp 06/11/22 0813 18     Temp 06/11/22 0813 98.1 F (36.7 C)     Temp Source 06/11/22 0813 Oral     SpO2 06/11/22 0813 98 %     Weight --      Height --      Head Circumference --      Peak Flow --      Pain Score 06/11/22 0812 9     Pain Loc --      Pain Edu? --      Excl. in [place]? --     Most recent vital signs: Vitals:   06/11/22 0813  BP: (!) 158/86  Pulse: 97  Resp: 18  Temp: 98.1 F (36.7 C)  SpO2: 98%     General: Awake, no distress.  CV:  Good peripheral perfusion.  Resp:  Normal effort.  Abd:  No distention.  Other:  Right knee exam shows mild ecchymosis with superficial abrasions.  No deformity noted.  Moderate tenderness palpation anteriorly.   ED Results / Procedures / Treatments   Labs (all labs ordered are listed, but only abnormal results are displayed) Labs Reviewed - No data to display   RADIOLOGY  X-ray of right ankle images were reviewed and interpreted by myself independent of the radiologist is negative for fracture or dislocation. X-ray of the right knee images were reviewed and interpreted by myself independent of the radiologist and no fracture or dislocation noted.  There is some mild degenerative changes present.  PROCEDURES:  Critical Care performed:    Procedures   MEDICATIONS ORDERED IN ED: Medications - No data to display   IMPRESSION / MDM / ASSESSMENT AND PLAN / ED COURSE  I reviewed the triage vital signs and the nursing notes.   Differential diagnosis includes, but is not limited to, fracture, dislocation right ankle, ankle sprain, contusion, right knee contusion, dislocation, fracture, internal derangement secondary to fall.  52 year old female presents to the ED after a fall that occurred this morning in which she had a mechanical fall.  Injuries were to her right knee and ankle.  X-rays were reassuring and patient was made aware that there was no fracture or dislocation.  She was placed in a knee immobilizer to wear when up walking.  She was made aware that she does not have to sleep in this.  Ice and elevation and use crutches for support and protection.  She is to follow-up with Dr. Joice Lofts if any continued problems or not improving.  Patient elects to take over-the-counter medication for the pain however a prescription for hydrocodone was sent to the pharmacy should she experience increased pain preventing her from sleep.  Patient's presentation is most consistent with acute complicated illness / injury requiring diagnostic workup.  FINAL CLINICAL IMPRESSION(S) / ED DIAGNOSES   Final diagnoses:  Contusion of right knee, initial encounter  Sprain of right ankle, unspecified ligament, initial encounter  Fall, initial encounter     Rx / DC Orders   ED Discharge Orders          Ordered    HYDROcodone-acetaminophen (NORCO/VICODIN) 5-325 MG tablet  Every 6 hours PRN        06/11/22 0912             Note:  This document was prepared using Dragon voice recognition software and may include unintentional dictation errors.   Tommi Rumps, PA-C 06/11/22 1249    Georga Hacking, MD 06/11/22 1357

## 2022-06-11 NOTE — ED Triage Notes (Signed)
Pt presents to ED with c/o of R knee pain after falling on concrete on R knee, abrasion noted. Pt also states R ankle pain. NAD noted.

## 2022-12-27 ENCOUNTER — Encounter: Payer: Self-pay | Admitting: Emergency Medicine

## 2022-12-27 DIAGNOSIS — N83291 Other ovarian cyst, right side: Secondary | ICD-10-CM | POA: Diagnosis not present

## 2022-12-27 DIAGNOSIS — I728 Aneurysm of other specified arteries: Secondary | ICD-10-CM | POA: Insufficient documentation

## 2022-12-27 DIAGNOSIS — K5790 Diverticulosis of intestine, part unspecified, without perforation or abscess without bleeding: Secondary | ICD-10-CM | POA: Diagnosis not present

## 2022-12-27 DIAGNOSIS — R1032 Left lower quadrant pain: Secondary | ICD-10-CM | POA: Diagnosis present

## 2022-12-27 LAB — URINALYSIS, ROUTINE W REFLEX MICROSCOPIC
Bilirubin Urine: NEGATIVE
Glucose, UA: NEGATIVE mg/dL
Hgb urine dipstick: NEGATIVE
Ketones, ur: NEGATIVE mg/dL
Leukocytes,Ua: NEGATIVE
Nitrite: NEGATIVE
Protein, ur: NEGATIVE mg/dL
Specific Gravity, Urine: 1.017 (ref 1.005–1.030)
pH: 5 (ref 5.0–8.0)

## 2022-12-27 LAB — CBC
HCT: 41.7 % (ref 36.0–46.0)
Hemoglobin: 13.7 g/dL (ref 12.0–15.0)
MCH: 28 pg (ref 26.0–34.0)
MCHC: 32.9 g/dL (ref 30.0–36.0)
MCV: 85.1 fL (ref 80.0–100.0)
Platelets: 219 10*3/uL (ref 150–400)
RBC: 4.9 MIL/uL (ref 3.87–5.11)
RDW: 13.4 % (ref 11.5–15.5)
WBC: 6.7 10*3/uL (ref 4.0–10.5)
nRBC: 0 % (ref 0.0–0.2)

## 2022-12-27 LAB — COMPREHENSIVE METABOLIC PANEL
ALT: 30 U/L (ref 0–44)
AST: 27 U/L (ref 15–41)
Albumin: 4.3 g/dL (ref 3.5–5.0)
Alkaline Phosphatase: 71 U/L (ref 38–126)
Anion gap: 8 (ref 5–15)
BUN: 18 mg/dL (ref 6–20)
CO2: 25 mmol/L (ref 22–32)
Calcium: 8.7 mg/dL — ABNORMAL LOW (ref 8.9–10.3)
Chloride: 104 mmol/L (ref 98–111)
Creatinine, Ser: 0.93 mg/dL (ref 0.44–1.00)
GFR, Estimated: >60 mL/min (ref >60)
Glucose, Bld: 114 mg/dL — ABNORMAL HIGH (ref 70–99)
Potassium: 3.8 mmol/L (ref 3.5–5.1)
Sodium: 137 mmol/L (ref 135–145)
Total Bilirubin: 0.4 mg/dL (ref <1.2)
Total Protein: 7.3 g/dL (ref 6.5–8.1)

## 2022-12-27 LAB — LIPASE, BLOOD: Lipase: 32 U/L (ref 11–51)

## 2022-12-27 LAB — POC URINE PREG, ED: Preg Test, Ur: NEGATIVE

## 2022-12-27 NOTE — ED Triage Notes (Signed)
Hx of diverticulitis and having left flank pain for 3 weeks. Getting worse. Began vomiting today. C/o constipation not diarr. States this feels similar to diverticulitis flare but not exactly. Pain also began going to R side last night. Passing gas.

## 2022-12-28 ENCOUNTER — Emergency Department
Admission: EM | Admit: 2022-12-28 | Discharge: 2022-12-28 | Disposition: A | Discharge status: Home / Self Care | Payer: Managed Care, Other (non HMO) | Attending: Emergency Medicine | Admitting: Emergency Medicine

## 2022-12-28 ENCOUNTER — Emergency Department: Payer: Managed Care, Other (non HMO)

## 2022-12-28 DIAGNOSIS — I728 Aneurysm of other specified arteries: Secondary | ICD-10-CM

## 2022-12-28 DIAGNOSIS — K5792 Diverticulitis of intestine, part unspecified, without perforation or abscess without bleeding: Secondary | ICD-10-CM

## 2022-12-28 DIAGNOSIS — N83201 Unspecified ovarian cyst, right side: Secondary | ICD-10-CM

## 2022-12-28 DIAGNOSIS — R1032 Left lower quadrant pain: Secondary | ICD-10-CM

## 2022-12-28 MED ORDER — PIPERACILLIN-TAZOBACTAM 3.375 G IVPB 30 MIN
3.3750 g | Freq: Once | INTRAVENOUS | Status: AC
  Administered 2022-12-28: 3.375 g via INTRAVENOUS
  Filled 2022-12-28: qty 50

## 2022-12-28 MED ORDER — ONDANSETRON 4 MG PO TBDP: 4.0000 mg | ORAL_TABLET | Freq: Three times a day (TID) | ORAL | 0 refills | Status: DC | PRN

## 2022-12-28 MED ORDER — ONDANSETRON HCL 4 MG/2ML IJ SOLN
4.0000 mg | Freq: Once | INTRAMUSCULAR | Status: AC
  Administered 2022-12-28: 4 mg via INTRAVENOUS
  Filled 2022-12-28: qty 2

## 2022-12-28 MED ORDER — KETOROLAC TROMETHAMINE 30 MG/ML IJ SOLN
10.0000 mg | Freq: Once | INTRAMUSCULAR | Status: AC
  Administered 2022-12-28: 9.9 mg via INTRAVENOUS
  Filled 2022-12-28: qty 1

## 2022-12-28 MED ORDER — IOHEXOL 350 MG/ML SOLN
100.0000 mL | Freq: Once | INTRAVENOUS | Status: AC | PRN
  Administered 2022-12-28: 100 mL via INTRAVENOUS

## 2022-12-28 MED ORDER — OXYCODONE-ACETAMINOPHEN 5-325 MG PO TABS: 1.0000 | ORAL_TABLET | ORAL | 0 refills | Status: DC | PRN

## 2022-12-28 MED ORDER — AMOXICILLIN-POT CLAVULANATE 875-125 MG PO TABS
1.0000 | ORAL_TABLET | Freq: Two times a day (BID) | ORAL | 0 refills | Status: DC
Start: 2022-12-28 — End: 2023-02-25

## 2022-12-28 NOTE — Discharge Instructions (Signed)
Take and finish. You may take pain medicine as needed.  Take a stool softener while you are taking the strong pain medicine to prevent constipation. Return to the ER for worsening symptoms, persistent vomiting, fever or other concerns.

## 2022-12-28 NOTE — ED Provider Notes (Signed)
Biotic as prescribed  [facility] Provider Note    Event Date/Time   First MD Initiated Contact with Patient 12/28/22 0321     (approximate)   History   Abdominal Pain   HPI  Paula Oliver is a 52 y.o. female who presents to the ED from home with a chief complaint of left flank/lower abdominal pain x 3 weeks, getting worse.  Vomited x 1 today.  Complains of constipation.  History of diverticulitis and states this feels similarly.  Denies fever/chills, chest pain, shortness of breath, dysuria.     Past Medical History   Past Medical History:  Diagnosis Date   Allergy    Anemia    Arthritis    Complication of anesthesia    Depression    history   Endometriosis    GERD (gastroesophageal reflux disease)    Headache    history of    History of colonoscopy    Hyperlipidemia    Insulin resistance    PCOS (polycystic ovarian syndrome)    PCOS (polycystic ovarian syndrome)    Pituitary tumor    PONV (postoperative nausea and vomiting)    Urinary incontinence    Uterine fibroid    Vertigo      Active Problem List   Patient Active Problem List   Diagnosis Date Noted   IUD threads lost 10/05/2017   Pelvic pain in female 10/05/2017   Fatigue 09/23/2017   Situational anxiety 01/02/2015   HLD (hyperlipidemia) 11/28/2013   PCOS (polycystic ovarian syndrome) 11/28/2013   Neuropathy 11/28/2013   Severe obesity (BMI >= 40) (HCC) 11/28/2013   Preventative health care 11/28/2013   History of pituitary tumor 11/28/2013   Menorrhagia 11/28/2013     Past Surgical History   Past Surgical History:  Procedure Laterality Date   ANKLE RECONSTRUCTION  03/2000   COLONOSCOPY WITH PROPOFOL N/A 10/08/2016   Procedure: COLONOSCOPY WITH PROPOFOL;  Surgeon: Christena Deem, MD;  Location: [facility] ENDOSCOPY;  Service: Endoscopy;  Laterality: N/A;   DILATION AND CURETTAGE OF UTERUS  07/2005   HYSTEROSCOPY WITH D & C N/A 08/02/2015   Procedure: DILATATION AND  CURETTAGE /HYSTEROSCOPY;  Surgeon: Elenora Fender Ward, MD;  Location: [facility] ORS;  Service: Gynecology;  Laterality: N/A;   INTRAUTERINE DEVICE (IUD) INSERTION N/A 08/02/2015   Procedure: INTRAUTERINE DEVICE (IUD) INSERTION;  Surgeon: Elenora Fender Ward, MD;  Location: [facility] ORS;  Service: Gynecology;  Laterality: N/A;   TRANSPHENOIDAL / TRANSNASAL HYPOPHYSECTOMY / RESECTION PITUITARY TUMOR  05/1995   Dr Mayford Knife   uterine laparoscopy  2004, 2006, 2007     Home Medications   Prior to Admission medications   Medication Sig Start Date End Date Taking? Authorizing Provider  amoxicillin-clavulanate (AUGMENTIN) 875-125 MG tablet Take 1 tablet by mouth 2 (two) times daily. 12/28/22  Yes Irean Hong, MD  ondansetron (ZOFRAN-ODT) 4 MG disintegrating tablet Take 1 tablet (4 mg total) by mouth every 8 (eight) hours as needed for nausea or vomiting. 12/28/22  Yes Irean Hong, MD  oxyCODONE-acetaminophen (PERCOCET/ROXICET) 5-325 MG tablet Take 1 tablet by mouth every 4 (four) hours as needed for severe pain (pain score 7-10). 12/28/22  Yes Irean Hong, MD  acetaminophen (TYLENOL) 325 MG tablet Take 650 mg by mouth every 6 (six) hours as needed.    [provider]  ALPRAZolam Prudy Feeler) 0.25 MG tablet Take 1 tablet by mouth once daily as needed for anxiety. 09/07/17   Doreene Nest, NP  HYDROcodone-acetaminophen (NORCO/VICODIN)  5-325 MG tablet Take 1 tablet by mouth every 6 (six) hours as needed. 06/11/22 06/11/23  Tommi Rumps, PA-C  ibuprofen (ADVIL,MOTRIN) 200 MG tablet Take 800 mg by mouth every 8 (eight) hours as needed for mild pain or moderate pain.     [provider]  lansoprazole (PREVACID) 15 MG capsule Take 15 mg by mouth daily at 12 noon.    [provider]     Allergies  Azithromycin, Coconut (cocos nucifera), and Phenergan [promethazine hcl]   Family History   Family History  Problem Relation Age of Onset   Arthritis Mother    Hyperlipidemia Mother    Hypertension  Mother    Alcohol abuse Father    Drug abuse Father    Arthritis Father    Hyperlipidemia Father    Hypertension Father    Mental illness Father    Mental illness Brother    Arthritis Maternal Grandmother    Hyperlipidemia Maternal Grandmother    Hypertension Maternal Grandmother    Diabetes Maternal Grandmother    Arthritis Maternal Grandfather    Hyperlipidemia Maternal Grandfather      Physical Exam  Triage Vital Signs: ED Triage Vitals  Encounter Vitals Group     BP 12/27/22 1933 (!) 162/86     Systolic BP Percentile --      Diastolic BP Percentile --      Pulse Rate 12/27/22 1933 96     Resp 12/27/22 1933 17     Temp 12/27/22 1933 99.3 F (37.4 C)     Temp Source 12/27/22 1933 Oral     SpO2 12/27/22 1933 97 %     Weight --      Height --      Head Circumference --      Peak Flow --      Pain Score 12/27/22 1932 4     Pain Loc --      Pain Education --      Exclude from Growth Chart --     Updated Vital Signs: BP (!) 170/89 (BP Location: Left Arm)   Pulse 93   Temp 98.1 F (36.7 C) (Oral)   Resp 16   LMP 11/22/2022   SpO2 96%    General: Awake, no distress.  CV:  RRR.  Good peripheral perfusion.  Resp:  Normal effort.  CTAB. Abd:  Mild tenderness to left lower quadrant without rebound or guarding.  No distention.  Other:  No truncal vesicles.   ED Results / Procedures / Treatments  Labs (all labs ordered are listed, but only abnormal results are displayed) Labs Reviewed  COMPREHENSIVE METABOLIC PANEL - Abnormal; Notable for the following components:      Result Value   Glucose, Bld 114 (*)    Calcium 8.7 (*)    All other components within normal limits  URINALYSIS, ROUTINE W REFLEX MICROSCOPIC - Abnormal; Notable for the following components:   Color, Urine YELLOW (*)    APPearance HAZY (*)    All other components within normal limits  LIPASE, BLOOD  CBC  POC URINE PREG, ED     EKG  None   RADIOLOGY I have independently visualized  and interpreted patient's imaging study as well as noted the radiology interpretation:  CT abdomen/pelvis: Diverticulitis without abscess or free air, constipation, splenic artery aneurysm, right ovarian cyst  Official radiology report(s): CT ABDOMEN PELVIS W CONTRAST  Result Date: 12/28/2022 CLINICAL DATA:  Left flank pain, suspected diverticulitis. Evaluate for complicated diverticulitis.  EXAM: CT ABDOMEN AND PELVIS WITH CONTRAST TECHNIQUE: Multidetector CT imaging of the abdomen and pelvis was performed using the standard protocol following bolus administration of intravenous contrast. RADIATION DOSE REDUCTION: This exam was performed according to the departmental dose-optimization program which includes automated exposure control, adjustment of the mA and/or kV according to patient size and/or use of iterative reconstruction technique. CONTRAST:  OMNIPAQUE IOHEXOL 350 MG/ML SOLN COMPARISON:  CTs with IV contrast 04/21/2021 and 10/06/2016. FINDINGS: Lower chest: No abnormality. Hepatobiliary: Liver is 21 cm length and mildly steatotic without mass enhancement. Gallbladder and bile ducts are unremarkable. Pancreas: No abnormality. Spleen: No mass enhancement. Slightly prominent measuring 13.6 cm length. There is a rim calcified 12 mm splenic artery aneurysm at the hilum of the spleen. Adrenals/Urinary Tract: Adrenal glands are unremarkable. Kidneys are normal, without renal calculi, focal lesion, or hydronephrosis. Bladder is unremarkable. Stomach/Bowel: Unremarkable stomach. Normal caliber small bowel without inflammatory change. Normal subcecal appendix. There is mild-to-moderate fecal stasis ascending and transverse colon. Diffuse diverticulosis mid/distal descending and sigmoid colon. There is inflammatory stranding over a portion of the distal descending colon consistent with acute diverticulitis. There is no abscess or free air. Vascular/Lymphatic: Aortic atherosclerosis. No enlarged abdominal  or pelvic lymph nodes. Multiple pelvic phleboliths. Reproductive: Fibroid uterus. Largest visible as a transmural dorsal mid body of uterus fibroid measuring 4.8 cm. Left ovary is unremarkable. There is a 4 cm homogeneous right ovarian cyst, Hounsfield density is 19. Other: Trace reactive fluid collects in the distal left paracolic gutter. No other free fluid is seen. There is no free hemorrhage, free air or incarcerated hernia. Small umbilical fat hernia. Musculoskeletal: Mild degenerative change thoracic and lumbar spine. No acute or significant osseous findings. There are bone islands in both femoral heads. IMPRESSION: 1. Acute diverticulitis distal descending colon, with trace reactive fluid in the distal left paracolic gutter. No abscess or free air. 2. Constipation. 3. Enlarged fatty liver.  Mild splenomegaly. 4. 12 mm rim calcified splenic artery aneurysm at the hilum of the spleen. No AAA. 5. Fibroid uterus. 6. 4 cm homogeneous right ovarian cyst. Recommend follow-up pelvic ultrasound in 6-12 months. Reference: JACR 2020 Feb;17(2):248-254. 7. Aortic atherosclerosis. Aortic Atherosclerosis (ICD10-I70.0). Electronically Signed   By: Almira Bar M.D.   On: 12/28/2022 01:44     PROCEDURES:  Critical Care performed: No  Procedures   MEDICATIONS ORDERED IN ED: Medications  iohexol (OMNIPAQUE) 350 MG/ML injection 100 mL (100 mLs Intravenous Contrast Given 12/28/22 0112)  piperacillin-tazobactam (ZOSYN) IVPB 3.375 g (0 g Intravenous Stopped 12/28/22 0423)  ketorolac (TORADOL) 30 MG/ML injection 9.9 mg (9.9 mg Intravenous Given 12/28/22 0334)  ondansetron (ZOFRAN) injection 4 mg (4 mg Intravenous Given 12/28/22 0334)     IMPRESSION / MDM / ASSESSMENT AND PLAN / ED COURSE  I reviewed the triage vital signs and the nursing notes.                             52 year old female presenting with abdominal pain. Differential diagnosis includes, but is not limited to, ovarian cyst, ovarian torsion,  acute appendicitis, diverticulitis, urinary tract infection/pyelonephritis, endometriosis, bowel obstruction, colitis, renal colic, gastroenteritis, hernia, fibroids, endometriosis, pregnancy related pain including ectopic pregnancy, etc. I have personally reviewed patient's records and note a telephone communication with her PCP regarding lab work from 08/27/2022.  Patient's presentation is most consistent with acute illness / injury with system symptoms.  Laboratory results demonstrate normal WBC 6.7, unremarkable electrolytes,  negative UA.  CT demonstrates diverticulitis and incidental findings of splenic artery aneurysm and right ovarian cyst.  Will administer IV Zosyn, ketorolac for pain paired with Zofran to prevent nausea.  Will discharge home on Augmentin and Percocet as needed.  Patient will follow-up closely with her PCP, GYN for follow-up ovarian cyst, as well as vascular surgery to evaluate splenic artery aneurysm.  Clinical Course as of 12/28/22 0434  Mon Dec 28, 2022  4540 IV antibiotics completed.  Will discharge home on Augmentin, as needed Percocet/Zofran.  Will refer to GYN for follow-up ovarian cyst as well as vascular surgery for follow-up splenic artery aneurysm.  Strict return precautions given.  Patient verbalizes understanding and agrees with plan of care. [JS]    Clinical Course User Index [JS] Irean Hong, MD     FINAL CLINICAL IMPRESSION(S) / ED DIAGNOSES   Final diagnoses:  Left lower quadrant abdominal pain  Diverticulitis  Cyst of right ovary  Splenic artery aneurysm (HCC)     Rx / DC Orders   ED Discharge Orders          Ordered    oxyCODONE-acetaminophen (PERCOCET/ROXICET) 5-325 MG tablet  Every 4 hours PRN        12/28/22 0327    ondansetron (ZOFRAN-ODT) 4 MG disintegrating tablet  Every 8 hours PRN        12/28/22 0327    amoxicillin-clavulanate (AUGMENTIN) 875-125 MG tablet  2 times daily        12/28/22 0327             Note:  This  document was prepared using Dragon voice recognition software and may include unintentional dictation errors.   Irean Hong, MD 12/28/22 [phone]

## 2022-12-30 ENCOUNTER — Ambulatory Visit (INDEPENDENT_AMBULATORY_CARE_PROVIDER_SITE_OTHER): Payer: Managed Care, Other (non HMO)

## 2022-12-30 ENCOUNTER — Other Ambulatory Visit (INDEPENDENT_AMBULATORY_CARE_PROVIDER_SITE_OTHER): Payer: Self-pay | Admitting: Nurse Practitioner

## 2022-12-30 DIAGNOSIS — I728 Aneurysm of other specified arteries: Secondary | ICD-10-CM

## 2023-01-26 ENCOUNTER — Encounter (INDEPENDENT_AMBULATORY_CARE_PROVIDER_SITE_OTHER): Payer: Self-pay | Admitting: Nurse Practitioner

## 2023-02-17 ENCOUNTER — Other Ambulatory Visit: Payer: Self-pay

## 2023-02-17 ENCOUNTER — Inpatient Hospital Stay
Admission: EM | Admit: 2023-02-17 | Discharge: 2023-02-20 | DRG: 600 | Disposition: A | Discharge status: Home / Self Care | Payer: Managed Care, Other (non HMO) | Attending: Internal Medicine | Admitting: Internal Medicine

## 2023-02-17 ENCOUNTER — Emergency Department: Payer: Managed Care, Other (non HMO)

## 2023-02-17 DIAGNOSIS — L02213 Cutaneous abscess of chest wall: Secondary | ICD-10-CM | POA: Diagnosis present

## 2023-02-17 DIAGNOSIS — N611 Abscess of the breast and nipple: Secondary | ICD-10-CM | POA: Diagnosis not present

## 2023-02-17 DIAGNOSIS — E669 Obesity, unspecified: Secondary | ICD-10-CM | POA: Diagnosis present

## 2023-02-17 DIAGNOSIS — F419 Anxiety disorder, unspecified: Secondary | ICD-10-CM | POA: Diagnosis not present

## 2023-02-17 DIAGNOSIS — K219 Gastro-esophageal reflux disease without esophagitis: Secondary | ICD-10-CM | POA: Diagnosis present

## 2023-02-17 DIAGNOSIS — Z886 Allergy status to analgesic agent status: Secondary | ICD-10-CM

## 2023-02-17 DIAGNOSIS — Z87891 Personal history of nicotine dependence: Secondary | ICD-10-CM

## 2023-02-17 DIAGNOSIS — Z8249 Family history of ischemic heart disease and other diseases of the circulatory system: Secondary | ICD-10-CM | POA: Diagnosis not present

## 2023-02-17 DIAGNOSIS — E785 Hyperlipidemia, unspecified: Secondary | ICD-10-CM | POA: Diagnosis not present

## 2023-02-17 DIAGNOSIS — L0291 Cutaneous abscess, unspecified: Principal | ICD-10-CM

## 2023-02-17 DIAGNOSIS — Z6839 Body mass index (BMI) 39.0-39.9, adult: Secondary | ICD-10-CM | POA: Diagnosis not present

## 2023-02-17 DIAGNOSIS — Z881 Allergy status to other antibiotic agents status: Secondary | ICD-10-CM | POA: Diagnosis not present

## 2023-02-17 DIAGNOSIS — N63 Unspecified lump in unspecified breast: Secondary | ICD-10-CM | POA: Diagnosis present

## 2023-02-17 LAB — POC URINE PREG, ED: Preg Test, Ur: NEGATIVE

## 2023-02-17 LAB — LACTIC ACID, PLASMA: Lactic Acid, Venous: 0.9 mmol/L (ref 0.5–1.9)

## 2023-02-17 MED ORDER — VANCOMYCIN HCL IN DEXTROSE 1-5 GM/200ML-% IV SOLN
1000.0000 mg | Freq: Once | INTRAVENOUS | Status: AC
  Administered 2023-02-17: 1000 mg via INTRAVENOUS
  Filled 2023-02-17: qty 200

## 2023-02-17 MED ORDER — MAGNESIUM HYDROXIDE 400 MG/5ML PO SUSP: 30.0000 mL | Freq: Every day | ORAL | Status: DC | PRN

## 2023-02-17 MED ORDER — SODIUM CHLORIDE 0.9 % IV SOLN
2.0000 g | INTRAVENOUS | Status: DC
  Administered 2023-02-17 – 2023-02-19 (×3): 2 g via INTRAVENOUS
  Filled 2023-02-17 (×3): qty 20

## 2023-02-17 MED ORDER — SODIUM CHLORIDE 0.9 % IV SOLN: INTRAVENOUS | Status: AC

## 2023-02-17 MED ORDER — ACETAMINOPHEN 650 MG RE SUPP: 650.0000 mg | Freq: Four times a day (QID) | RECTAL | Status: DC | PRN

## 2023-02-17 MED ORDER — OXYCODONE-ACETAMINOPHEN 5-325 MG PO TABS
1.0000 | ORAL_TABLET | ORAL | Status: DC | PRN
  Administered 2023-02-17 – 2023-02-19 (×8): 1 via ORAL
  Filled 2023-02-17 (×8): qty 1

## 2023-02-17 MED ORDER — ONDANSETRON HCL 4 MG/2ML IJ SOLN: 4.0000 mg | Freq: Four times a day (QID) | INTRAMUSCULAR | Status: DC | PRN

## 2023-02-17 MED ORDER — PANTOPRAZOLE SODIUM 40 MG PO TBEC
40.0000 mg | DELAYED_RELEASE_TABLET | Freq: Every day | ORAL | Status: DC
  Administered 2023-02-18 – 2023-02-19 (×2): 40 mg via ORAL
  Filled 2023-02-17 (×2): qty 1

## 2023-02-17 MED ORDER — ACETAMINOPHEN 325 MG PO TABS
650.0000 mg | ORAL_TABLET | Freq: Four times a day (QID) | ORAL | Status: DC | PRN
  Administered 2023-02-19: 650 mg via ORAL
  Filled 2023-02-17: qty 2

## 2023-02-17 MED ORDER — TRAZODONE HCL 50 MG PO TABS: 25.0000 mg | ORAL_TABLET | Freq: Every evening | ORAL | Status: DC | PRN

## 2023-02-17 MED ORDER — ONDANSETRON HCL 4 MG PO TABS: 4.0000 mg | ORAL_TABLET | Freq: Four times a day (QID) | ORAL | Status: DC | PRN

## 2023-02-17 MED ORDER — ENOXAPARIN SODIUM 40 MG/0.4ML IJ SOSY: 40.0000 mg | PREFILLED_SYRINGE | INTRAMUSCULAR | Status: DC

## 2023-02-17 MED ORDER — MORPHINE SULFATE (PF) 2 MG/ML IV SOLN: 2.0000 mg | INTRAVENOUS | Status: DC | PRN

## 2023-02-17 MED ORDER — ALPRAZOLAM 0.25 MG PO TABS: 0.2500 mg | ORAL_TABLET | Freq: Every day | ORAL | Status: DC | PRN

## 2023-02-17 MED ORDER — VANCOMYCIN HCL 1500 MG/300ML IV SOLN
1500.0000 mg | Freq: Once | INTRAVENOUS | Status: AC
  Administered 2023-02-18: 1500 mg via INTRAVENOUS
  Filled 2023-02-17 (×2): qty 300

## 2023-02-17 MED ORDER — VANCOMYCIN HCL IN DEXTROSE 1-5 GM/200ML-% IV SOLN: 1000.0000 mg | Freq: Once | INTRAVENOUS | Status: DC

## 2023-02-17 NOTE — ED Notes (Addendum)
 Pt provided sandwich bag and water per verbal order from Valente David M.D. Pt NPO after midnight

## 2023-02-17 NOTE — ED Provider Triage Note (Addendum)
 Emergency Medicine Provider Triage Evaluation Note  Paula Oliver , a 53 y.o. female  was evaluated in triage.  Pt complains of abscess in leftbreast. Patient states to have a small lesion for 8 months. Since Sunday started growing. Started dicloxacillin and the redness in skin spread. Today went to [place] [facility] . They  tried to drain it, obtained scant purulent fluid. They referred her to ED  Review of Systems  Positive:  Negative:  Physical Exam  BP (!) 150/113   Pulse 86   Temp 98.5 F (36.9 C)   Resp 18   Ht 5' 5 (1.651 m)   Wt 108.9 kg   SpO2 96%   BMI 39.94 kg/m  Gen:   Awake, no distress   Resp:  Normal effort  MSK:   Moves extremities without difficulty  Other:  Left Chest. Area of redness. See picture  Medical Decision Making  Medically screening exam initiated at 3:32 PM.  Appropriate orders placed.  Paula Oliver was informed that the remainder of the evaluation will be completed by another provider, this initial triage assessment does not replace that evaluation, and the importance of remaining in the ED until their evaluation is complete.  Patient with abscess, plan to have US  to localize it.   Janit Kast, PA-C 02/17/23 1537    Janit Kast, PA-C 02/17/23 1556

## 2023-02-17 NOTE — Progress Notes (Signed)
 ED Pharmacy Antibiotic Sign Off An antibiotic consult was received from an ED provider for vancomycin  per pharmacy dosing for wound infection. A chart review was completed to assess appropriateness.   The following one time order(s) were placed:  Vancomycin  2500 mg IV x 1  Further antibiotic and/or antibiotic pharmacy consults should be ordered by the admitting provider if indicated.   Thank you for allowing pharmacy to be a part of this patient's care.   Lum VEAR Mania, [facility]  Clinical Pharmacist 02/17/23 10:14 PM

## 2023-02-17 NOTE — ED Provider Notes (Signed)
 [facility] Provider Note    Event Date/Time   First MD Initiated Contact with Patient 02/17/23 2156     (approximate)   History   Abscess   HPI  Paula Oliver is a 53 y.o. female with a history of insulin resistance  Patient presents for swelling and concerns for an abscess of the left breast for about 3 days.  She reports a couple months ago she got redness and a little pustule after a mosquito bite here in [place] .  Area never really did anything to [place] spot without all of a sudden about 3 days ago it started to swell up and became very red.  She has been on doxycycline for about 2 or 3 days and the symptoms of worsening redness is spreading over the breast.  Saw her doctor today they attempted to drain it and were concerned she may have an abscess referred to the ER for IV antibiotic and further evaluation  No fevers or chills no nausea or vomiting.  No weakness.     Physical Exam   Triage Vital Signs: ED Triage Vitals  Encounter Vitals Group     BP 02/17/23 1520 (!) 150/113     Systolic BP Percentile --      Diastolic BP Percentile --      Pulse Rate 02/17/23 1520 86     Resp 02/17/23 1520 18     Temp 02/17/23 1520 98.5 F (36.9 C)     Temp src --      SpO2 02/17/23 1520 96 %     Weight 02/17/23 1523 240 lb (108.9 kg)     Height 02/17/23 1523 5' 5 (1.651 m)     Head Circumference --      Peak Flow --      Pain Score 02/17/23 1523 3     Pain Loc --      Pain Education --      Exclude from Growth Chart --     Most recent vital signs: Vitals:   02/17/23 2256 02/17/23 2257  BP:    Pulse:    Resp:    Temp: 98 F (36.7 C)   SpO2:  100%     General: Awake, no distress.  Very pleasant CV:  Good peripheral perfusion.  Patient able to demonstrate that her left upper breast she has air about the size of the palm that is erythematous warm to touch, central incision which was attempted by her physician noted pus draining,  erythema spreading somewhat inferiorly along the margin of the breast superior to the areola Resp:  Normal effort.  Clear bilateral Abd:  No distention.  Other:     ED Results / Procedures / Treatments   Labs (all labs ordered are listed, but only abnormal results are displayed) Labs Reviewed  CULTURE, BLOOD (ROUTINE X 2)  CULTURE, BLOOD (ROUTINE X 2)  LACTIC ACID, PLASMA  HIV ANTIBODY (ROUTINE TESTING W REFLEX)  BASIC METABOLIC PANEL  CBC  POC URINE PREG, ED     EKG     RADIOLOGY  US  LIMITED ULTRASOUND INCLUDING AXILLA LEFT BREAST  Result Date: 02/17/2023 CLINICAL DATA:  [id number] Abscess of breast, left [id number]. Per provided history, patient underwent attempted I and D in clinic without expression of any significant fluid. EXAM: ULTRASOUND OF THE LEFT BREAST COMPARISON:  Previous exam(s). FINDINGS: Media tab image of physical exam findings were reviewed. There is erythema in the upper breast as well as a  skin nick noted in the upper breast. Targeted ultrasound was performed of the site of palpable concern in the LEFT breast near the region of the skin nick. At 10:30 15 cm from the nipple, there is a superficial interdigitating heterogeneous fluid collection. This spans approximately 4.4 x 1.2 by 2.7 cm. There is adjacent edema and hyperemia with overlying skin thickening. Per sonographer report, this area is located subjacent to the skin nick and is approximately 7 mm deep to the skin nick on sonographic images (series 1, image 9). At 10 o'clock 15 cm from the nipple, there is a superficial hypoechoic mass noted subjacent to the skin with a possible tract to the skin surface. This measures 13 x 10 by 13 mm. IMPRESSION: 1. There is a 4.4 cm interdigitating fluid collection in the LEFT breast at 10:30 15 cm from the nipple. This is concerning for abscess. 2. There is a 13 mm superficial hypoechoic mass at 10 o'clock 15 cm from the nipple. This may reflect a small abscess or phlegmon versus  superficial mass. Recommend short-term follow-up to assess for resolution. RECOMMENDATION: 1. Recommend conservative management of abscess with clinical follow-up to resolution. Ultrasound guided aspiration of the heterogeneous fluid collection at dedicated breast center could be considered. 2. Recommend repeat LEFT breast ultrasound (with LEFT breast mammogram if deemed necessary) in 4 weeks to assess for resolution of a superficial mass at 10 o'clock 15 cm from the nipple. BI-RADS CATEGORY  3: Probably benign. Electronically Signed   By: Corean Salter M.D.   On: 02/17/2023 16:40   Imaging is concerning for breast abscess  Informed patient of recommendation for close follow-up as well, and she is agreeable for repeat evaluation with her doctor and further breast imaging after resolution   PROCEDURES:  Critical Care performed: No  Procedures   MEDICATIONS ORDERED IN ED: Medications  vancomycin  (VANCOCIN ) IVPB 1000 mg/200 mL premix (1,000 mg Intravenous New Bag/Given 02/17/23 2242)    And  vancomycin  (VANCOREADY) IVPB 1500 mg/300 mL (has no administration in time range)  oxyCODONE -acetaminophen  (PERCOCET/ROXICET) 5-325 MG per tablet 1 tablet (has no administration in time range)  ALPRAZolam  (XANAX ) tablet 0.25 mg (has no administration in time range)  pantoprazole  (PROTONIX ) EC tablet 40 mg (has no administration in time range)  morphine  (PF) 2 MG/ML injection 2 mg (has no administration in time range)  enoxaparin  (LOVENOX ) injection 40 mg (has no administration in time range)  0.9 %  sodium chloride  infusion (has no administration in time range)  acetaminophen  (TYLENOL ) tablet 650 mg (has no administration in time range)    Or  acetaminophen  (TYLENOL ) suppository 650 mg (has no administration in time range)  traZODone  (DESYREL ) tablet 25 mg (has no administration in time range)  magnesium  hydroxide (MILK OF MAGNESIA) suspension 30 mL (has no administration in time range)  ondansetron   (ZOFRAN ) tablet 4 mg (has no administration in time range)    Or  ondansetron  (ZOFRAN ) injection 4 mg (has no administration in time range)  cefTRIAXone  (ROCEPHIN ) 2 g in sodium chloride  0.9 % 100 mL IVPB (has no administration in time range)     IMPRESSION / MDM / ASSESSMENT AND PLAN / ED COURSE  I reviewed the triage vital signs and the nursing notes.                              Basic metabolic panel from [facility] chart today denotes no acute findings slightly elevated BUN  at 21.  Normal CBC  Reviewed [facility] notes patient received 1 g of Rocephin  at her doctor's office today  Differential diagnosis includes, but is not limited to, left breast cellulitis, phlegmon, abscess, resistant infection etc.  Currently on doxycycline with worsening symptoms.  She was given a dose of Rocephin  at her doctor's office will also give vancomycin  here.  I placed consult to general surgery.  Findings concerning for abscess.  Patient's presentation is most consistent with acute complicated illness / injury requiring diagnostic workup.   She reports only mild minimal discomfort while at rest.  Currently not requesting or desire any pain medications.  Labs interpreted as normal lactic acid  ----------------------------------------- 11:34 PM on 02/17/2023 ----------------------------------------- General surgery, Dr. Lane will be providing consultation for the patient this evening.  He recommends admission to hospitalist service with general surgery to see and evaluate.  Patient understanding agreeable with plan have started on vancomycin  for broad coverage on top of the Rocephin  she is already received.  Patient agreeable with plan for admission, surgery consult.  Consulted with patient accepted to hospital service by Dr. Lawence due to concerns of complicated abscess of breast area and cellulitis worsening despite outpatient antibiotics.  Does NOT meet sepsis criteria at this time       FINAL  CLINICAL IMPRESSION(S) / ED DIAGNOSES   Final diagnoses:  Abscess     Rx / DC Orders   ED Discharge Orders     None        Note:  This document was prepared using Dragon voice recognition software and may include unintentional dictation errors.   Dicky Anes, MD 02/17/23 [phone]

## 2023-02-17 NOTE — Progress Notes (Signed)
 Pharmacy Antibiotic Note  Paula Oliver is a 53 y.o. female admitted on 02/17/2023 with cellulitis.  Pharmacy has been consulted for Vancomycin  dosing for 7 days.  Plan: Pt given Vancomycin  2500 mg once. Vancomycin  1750 mg IV Q 24 hrs. Goal AUC 400-550. Expected AUC: 488.2 SCr used: 0.8, Vd used: 0.5, BMI: 39.9  Pharmacy will continue to follow and will adjust abx dosing whenever warranted.  Temp (24hrs), Avg:98.3 F (36.8 C), Min:98 F (36.7 C), Max:98.5 F (36.9 C)   No results for input(s): WBC, CREATININE, LATICACIDVEN, VANCOTROUGH, VANCOPEAK, VANCORANDOM, GENTTROUGH, GENTPEAK, GENTRANDOM, TOBRATROUGH, TOBRAPEAK, TOBRARND, AMIKACINPEAK, AMIKACINTROU, AMIKACIN in the last 168 hours.  CrCl cannot be calculated (Patient's most recent lab result is older than the maximum 21 days allowed.).    Allergies  Allergen Reactions   Azithromycin Other (See Comments)    Stomach cramping   Coconut (Cocos Nucifera)    Phenergan [Promethazine Hcl] Nausea And Vomiting    Antimicrobials this admission: 01/08 Ceftriaxone  >>  01/08 Vancomycin  >> x 7 days  Microbiology results: 01/08 BCx: Pending  Thank you for allowing pharmacy to be a part of this patient's care.  Rankin CANDIE Dills, PharmD, MBA 02/17/2023 11:00 PM

## 2023-02-17 NOTE — ED Triage Notes (Signed)
 Pt to ED for abscess to left chest started a few days ago. Reports went to [facility] a few days ago and started on antibiotics. Went back to [facility] today and advised to come to ED. Denies fevers.  Pt has labs drawn today visible in epic.

## 2023-02-18 ENCOUNTER — Inpatient Hospital Stay: Payer: Managed Care, Other (non HMO)

## 2023-02-18 DIAGNOSIS — E669 Obesity, unspecified: Secondary | ICD-10-CM | POA: Insufficient documentation

## 2023-02-18 DIAGNOSIS — E785 Hyperlipidemia, unspecified: Secondary | ICD-10-CM | POA: Diagnosis not present

## 2023-02-18 DIAGNOSIS — F419 Anxiety disorder, unspecified: Secondary | ICD-10-CM

## 2023-02-18 DIAGNOSIS — K219 Gastro-esophageal reflux disease without esophagitis: Secondary | ICD-10-CM

## 2023-02-18 DIAGNOSIS — N611 Abscess of the breast and nipple: Secondary | ICD-10-CM | POA: Diagnosis not present

## 2023-02-18 LAB — BASIC METABOLIC PANEL
Anion gap: 10 (ref 5–15)
BUN: 17 mg/dL (ref 6–20)
CO2: 26 mmol/L (ref 22–32)
Calcium: 8.5 mg/dL — ABNORMAL LOW (ref 8.9–10.3)
Chloride: 103 mmol/L (ref 98–111)
Creatinine, Ser: 0.8 mg/dL (ref 0.44–1.00)
GFR, Estimated: >60 mL/min (ref >60)
Glucose, Bld: 109 mg/dL — ABNORMAL HIGH (ref 70–99)
Potassium: 3.7 mmol/L (ref 3.5–5.1)
Sodium: 139 mmol/L (ref 135–145)

## 2023-02-18 LAB — CBC
HCT: 35.9 % — ABNORMAL LOW (ref 36.0–46.0)
Hemoglobin: 11.9 g/dL — ABNORMAL LOW (ref 12.0–15.0)
MCH: 27.6 pg (ref 26.0–34.0)
MCHC: 33.1 g/dL (ref 30.0–36.0)
MCV: 83.3 fL (ref 80.0–100.0)
Platelets: 192 10*3/uL (ref 150–400)
RBC: 4.31 MIL/uL (ref 3.87–5.11)
RDW: 13.3 % (ref 11.5–15.5)
WBC: 7.8 10*3/uL (ref 4.0–10.5)
nRBC: 0 % (ref 0.0–0.2)

## 2023-02-18 LAB — HIV ANTIBODY (ROUTINE TESTING W REFLEX): HIV Screen 4th Generation wRfx: NONREACTIVE

## 2023-02-18 MED ORDER — VANCOMYCIN HCL 1750 MG/350ML IV SOLN
1750.0000 mg | INTRAVENOUS | Status: DC
  Administered 2023-02-18 – 2023-02-19 (×2): 1750 mg via INTRAVENOUS
  Filled 2023-02-18 (×2): qty 350

## 2023-02-18 MED ORDER — LIDOCAINE HCL (PF) 1 % IJ SOLN
5.0000 mL | Freq: Once | INTRAMUSCULAR | Status: AC
  Administered 2023-02-18: 5 mL via INTRADERMAL

## 2023-02-18 MED ORDER — VENLAFAXINE HCL 37.5 MG PO TABS
75.0000 mg | ORAL_TABLET | Freq: Two times a day (BID) | ORAL | Status: DC
  Administered 2023-02-18 – 2023-02-20 (×4): 75 mg via ORAL
  Filled 2023-02-18 (×5): qty 2

## 2023-02-18 MED ORDER — SERTRALINE HCL 100 MG PO TABS
100.0000 mg | ORAL_TABLET | Freq: Every day | ORAL | Status: DC
  Administered 2023-02-18 – 2023-02-20 (×3): 100 mg via ORAL
  Filled 2023-02-18 (×3): qty 1

## 2023-02-18 MED ORDER — ENOXAPARIN SODIUM 60 MG/0.6ML IJ SOSY
55.0000 mg | PREFILLED_SYRINGE | INTRAMUSCULAR | Status: DC
  Administered 2023-02-18 – 2023-02-19 (×2): 55 mg via SUBCUTANEOUS
  Filled 2023-02-18 (×3): qty 0.6

## 2023-02-18 NOTE — Consult Note (Signed)
 [facility] SURGICAL CONSULTATION NOTE (initial) - cpt: 00756  HISTORY OF PRESENT ILLNESS (HPI):  53 y.o. female presented to [facility] ED yesterday for evaluation of left breast erythema and swelling. Patient reports she first noticed an area of swelling and erythema to the inner upper quadrant of her left breast on Sunday. This continued to progress throughout the week. She denied any fever, chills, cough, CP, SOB, or appreciable lymphadenopathy with this. She did initially present to the walk in clinic for this and superficial I&D was attempted but unsuccessful. She had previously been on doxycycline for this and was given 1g Rocephin  at the walk in clinic. As such, she presented to the ED. Work up in the ED revealed a normal WBC at 7.8K. BCx are negative as well. She did undergo US  concerning for 4.4 cm fluid collection, likely abscess. She was admitted to the medicine service. Started on Rocephin  and Vancomycin .    Surgery is consulted by emergency medicine physician Dr. Oneil Budge, MD in this context for evaluation and management of left breast abscess and cellulitis.  PAST MEDICAL HISTORY (PMH):  Past Medical History:  Diagnosis Date   Allergy    Anemia    Arthritis    Complication of anesthesia    Depression    history   Endometriosis    GERD (gastroesophageal reflux disease)    Headache    history of    History of colonoscopy    Hyperlipidemia    Insulin resistance    PCOS (polycystic ovarian syndrome)    PCOS (polycystic ovarian syndrome)    Pituitary tumor    PONV (postoperative nausea and vomiting)    Urinary incontinence    Uterine fibroid    Vertigo      PAST SURGICAL HISTORY (PSH):  Past Surgical History:  Procedure Laterality Date   ANKLE RECONSTRUCTION  03/2000   COLONOSCOPY WITH PROPOFOL  N/A 10/08/2016   Procedure: COLONOSCOPY WITH PROPOFOL ;  Surgeon: Gaylyn Gladis PENNER, MD;  Location: [facility] ENDOSCOPY;  Service: Endoscopy;  Laterality: N/A;   DILATION  AND CURETTAGE OF UTERUS  07/2005   HYSTEROSCOPY WITH D & C N/A 08/02/2015   Procedure: DILATATION AND CURETTAGE /HYSTEROSCOPY;  Surgeon: Mitzie BROCKS Ward, MD;  Location: [facility] ORS;  Service: Gynecology;  Laterality: N/A;   INTRAUTERINE DEVICE (IUD) INSERTION N/A 08/02/2015   Procedure: INTRAUTERINE DEVICE (IUD) INSERTION;  Surgeon: Mitzie BROCKS Ward, MD;  Location: [facility] ORS;  Service: Gynecology;  Laterality: N/A;   TRANSPHENOIDAL / TRANSNASAL HYPOPHYSECTOMY / RESECTION PITUITARY TUMOR  05/1995   Dr Shlomo   uterine laparoscopy  2004, 2006, 2007     MEDICATIONS:  Prior to Admission medications   Medication Sig Start Date End Date Taking? Authorizing Provider  acetaminophen  (TYLENOL ) 325 MG tablet Take 650 mg by mouth every 6 (six) hours as needed.    [provider]  ALPRAZolam  (XANAX ) 0.25 MG tablet Take 1 tablet by mouth once daily as needed for anxiety. 09/07/17   Clark, Katherine K, NP  amoxicillin -clavulanate (AUGMENTIN ) 875-125 MG tablet Take 1 tablet by mouth 2 (two) times daily. 12/28/22   Sung, Jade J, MD  HYDROcodone -acetaminophen  (NORCO/VICODIN) 5-325 MG tablet Take 1 tablet by mouth every 6 (six) hours as needed. 06/11/22 06/11/23  Saunders Shona CROME, PA-C  ibuprofen (ADVIL,MOTRIN) 200 MG tablet Take 800 mg by mouth every 8 (eight) hours as needed for mild pain or moderate pain.     [provider]  lansoprazole (PREVACID) 15 MG capsule Take 15 mg by  mouth daily at 12 noon.    [provider]  ondansetron  (ZOFRAN -ODT) 4 MG disintegrating tablet Take 1 tablet (4 mg total) by mouth every 8 (eight) hours as needed for nausea or vomiting. 12/28/22   Sung, Jade J, MD  oxyCODONE -acetaminophen  (PERCOCET/ROXICET) 5-325 MG tablet Take 1 tablet by mouth every 4 (four) hours as needed for severe pain (pain score 7-10). 12/28/22   Sung, Jade J, MD     ALLERGIES:  Allergies  Allergen Reactions   Azithromycin Other (See Comments)    Stomach cramping   Coconut (Cocos Nucifera)     Phenergan [Promethazine Hcl] Nausea And Vomiting     SOCIAL HISTORY:  Social History   Socioeconomic History   Marital status: Married    Spouse name: Not on file   Number of children: Not on file   Years of education: Not on file   Highest education level: Not on file  Occupational History   Not on file  Tobacco Use   Smoking status: Former    Current packs/day: 0.00    Types: Cigarettes    Start date: 02/09/1997    Quit date: 02/10/1999    Years since quitting: 24.0   Smokeless tobacco: Never  Vaping Use   Vaping status: Never Used  Substance and Sexual Activity   Alcohol use: No    Alcohol/week: 0.0 standard drinks of alcohol   Drug use: No   Sexual activity: Never  Other Topics Concern   Not on file  Social History Narrative   Not on file   Social Drivers of Health   Financial Resource Strain: Not on file  Food Insecurity: Not on file  Transportation Needs: Not on file  Physical Activity: Not on file  Stress: Not on file  Social Connections: Not on file  Intimate Partner Violence: Not on file     FAMILY HISTORY:  Family History  Problem Relation Age of Onset   Arthritis Mother    Hyperlipidemia Mother    Hypertension Mother    Alcohol abuse Father    Drug abuse Father    Arthritis Father    Hyperlipidemia Father    Hypertension Father    Mental illness Father    Mental illness Brother    Arthritis Maternal Grandmother    Hyperlipidemia Maternal Grandmother    Hypertension Maternal Grandmother    Diabetes Maternal Grandmother    Arthritis Maternal Grandfather    Hyperlipidemia Maternal Grandfather       REVIEW OF SYSTEMS:  Review of Systems  Constitutional:  Negative for chills and fever.  Respiratory:  Negative for cough and shortness of breath.   Cardiovascular:  Negative for chest pain and palpitations.  Gastrointestinal:  Negative for nausea and vomiting.  Genitourinary:  Negative for dysuria and urgency.  Skin:  Negative for itching and  rash.       + Left Breast Cellulitis/Abscess   All other systems reviewed and are negative.   VITAL SIGNS:  Temp:  [97.9 F (36.6 C)-98.5 F (36.9 C)] 97.9 F (36.6 C) (01/09 0806) Pulse Rate:  [75-86] 75 (01/09 0806) Resp:  [15-18] 16 (01/09 0806) BP: (138-150)/(63-113) 138/63 (01/09 0806) SpO2:  [94 %-100 %] 98 % (01/09 0806) Weight:  [108.9 kg] 108.9 kg (01/08 1523)     Height: 5' 5 (165.1 cm) Weight: 108.9 kg BMI (Calculated): 39.94   INTAKE/OUTPUT:  01/08 0701 - 01/09 0700 In: 306.7 [IV Piggyback:306.7] Out: -   PHYSICAL EXAM:  Physical Exam Vitals and nursing  note reviewed. Exam conducted with a chaperone present.  Constitutional:      General: She is not in acute distress.    Appearance: Normal appearance. She is not ill-appearing.     Comments: Patient resting in bed; NAD Bedside RN present   HENT:     Head: Normocephalic and atraumatic.  Eyes:     General: No scleral icterus.    Conjunctiva/sclera: Conjunctivae normal.  Pulmonary:     Effort: Pulmonary effort is normal. No respiratory distress.  Chest:     Chest wall: Tenderness present.     Comments: RN at bedside. Patient in hallway bed so examination limited:  She does have erythema and induration to the left breast in the upper inner quadrant about 10-12 o'clock position. No supraclavicular or axillary LAD appreciable.   Again further examination limited as patient is in hallway bed Genitourinary:    Comments: Deferred Neurological:     General: No focal deficit present.     Mental Status: She is alert and oriented to person, place, and time.  Psychiatric:        Mood and Affect: Mood normal.        Behavior: Behavior normal.      Labs:     Latest Ref Rng & Units 02/18/2023    3:10 AM 12/27/2022    7:35 PM 04/21/2021    3:23 PM  CBC  WBC 4.0 - 10.5 K/uL 7.8  6.7  7.9   Hemoglobin 12.0 - 15.0 g/dL 88.0  86.2  86.1   Hematocrit 36.0 - 46.0 % 35.9  41.7  40.8   Platelets 150 - 400 K/uL 192  219   222       Latest Ref Rng & Units 02/18/2023    3:10 AM 12/27/2022    7:35 PM 04/21/2021    3:23 PM  CMP  Glucose 70 - 99 mg/dL 890  885  830   BUN 6 - 20 mg/dL 17  18  13    Creatinine 0.44 - 1.00 mg/dL 9.19  9.06  9.15   Sodium 135 - 145 mmol/L 139  137  138   Potassium 3.5 - 5.1 mmol/L 3.7  3.8  3.6   Chloride 98 - 111 mmol/L 103  104  105   CO2 22 - 32 mmol/L 26  25  26    Calcium 8.9 - 10.3 mg/dL 8.5  8.7  9.4   Total Protein 6.5 - 8.1 g/dL  7.3  7.5   Total Bilirubin <1.2 mg/dL  0.4  0.6   Alkaline Phos 38 - 126 U/L  71  87   AST 15 - 41 U/L  27  23   ALT 0 - 44 U/L  30  29      Imaging studies:   US  Left Breast (02/16/2022) personally reviewed with 4 cm fluid collection concerning for abscess, and radiologist report reviewed below:  RECOMMENDATION: 1. Recommend conservative management of abscess with clinical follow-up to resolution. Ultrasound guided aspiration of the heterogeneous fluid collection at dedicated breast center could be considered. 2. Recommend repeat LEFT breast ultrasound (with LEFT breast mammogram if deemed necessary) in 4 weeks to assess for resolution of a superficial mass at 10 o'clock 15 cm from the nipple.   BI-RADS CATEGORY  3: Probably benign.   Assessment/Plan: (ICD-10's: N61.1) 53 y.o. female with left breast abscess.   - Appreciate medicine admission - Discussed case with radiology; IR agreeable with aspiration +/- drain placement. Appreciate their assistance -  NPO for planned procedure - Continue IV Abx (Rocephin , Vancomycin ); will result Cx with drainage  - Pain control prn; antiemetics prn   - Mobilize as tolerated   - Further management per primary service; we will follow   All of the above findings and recommendations were discussed with the patient, and all of patient's questions were answered to herexpressed satisfaction.  Thank you for the opportunity to participate in this patient's care.   -- Arthea Platt, PA-C [facility] 02/18/2023, 9:03 AM M-F: 7am - 4pm

## 2023-02-18 NOTE — Assessment & Plan Note (Addendum)
-   This is apparently currently diet managed.

## 2023-02-18 NOTE — Assessment & Plan Note (Signed)
-   The patient will be admitted to a medical-surgical bed. - Pain management will be provided. - Will continue antibiotic therapy with IV vancomycin  and Rocephin . - General Surgery consult will be obtained. - Dr. Lane was notified and is aware about the patient.

## 2023-02-18 NOTE — Assessment & Plan Note (Signed)
-   We will continue Effexor XR and trazodone as well as Zoloft.

## 2023-02-18 NOTE — Hospital Course (Addendum)
 Paula [facility] is a 53 y.o. Caucasian female with medical history significant for osteoarthritis, GERD, depression, dyslipidemia and PCOS, who presented to the emergency room with acute onset of left upper breast swelling with erythema and tenderness, warmth and pain Ultrasound showed a breast abscess, patient was started on antibiotics with vancomycin  and Rocephin .  General surgery consult obtained, IR drain performed 9/9.  Initial culture came back with gram-positive cocci in pairs.  Antibiotic continued with Rocephin  and vancomycin . Wound culture came back today, no growth in 48 hours.  Will continue antibiotics as discussed with per surgery, will start Augmentin  and clindamycin  for 7 days.  Patient be followed by general surgery and breast center in the near future.

## 2023-02-18 NOTE — Plan of Care (Signed)
  Problem: Education: Goal: Knowledge of General Education information will improve Description: Including pain rating scale, medication(s)/side effects and non-pharmacologic comfort measures Outcome: Progressing   Problem: Health Behavior/Discharge Planning: Goal: Ability to manage health-related needs will improve Outcome: Progressing   Problem: Clinical Measurements: Goal: Ability to maintain clinical measurements within normal limits will improve Outcome: Progressing Goal: Will remain free from infection Outcome: Progressing Goal: Diagnostic test results will improve Outcome: Progressing Goal: Respiratory complications will improve Outcome: Progressing Goal: Cardiovascular complication will be avoided Outcome: Progressing   Problem: Coping: Goal: Level of anxiety will decrease Outcome: Progressing   Problem: Elimination: Goal: Will not experience complications related to bowel motility Outcome: Progressing Goal: Will not experience complications related to urinary retention Outcome: Progressing   Problem: Safety: Goal: Ability to remain free from injury will improve Outcome: Progressing   Problem: Pain Management: Goal: General experience of comfort will improve Outcome: Progressing

## 2023-02-18 NOTE — Assessment & Plan Note (Signed)
-   We will continue Xanax.

## 2023-02-18 NOTE — Assessment & Plan Note (Signed)
 Will continue PPI therapy.

## 2023-02-18 NOTE — H&P (Addendum)
 Paula Oliver   PATIENT NAME: Paula Oliver    MR#:  [id number]  DATE OF BIRTH:  1970/10/04  DATE OF ADMISSION:  02/17/2023  PRIMARY CARE PHYSICIAN: Sherial Bail, MD   Patient is coming from: Home  REQUESTING/REFERRING PHYSICIAN: Dicky Anes, MD  CHIEF COMPLAINT:   Chief Complaint  Patient presents with   Abscess    HISTORY OF PRESENT ILLNESS:  Paula Oliver is a 53 y.o. Caucasian female with medical history significant for osteoarthritis, GERD, depression, dyslipidemia and PCOS, who presented to the emergency room with acute onset of left upper breast swelling with erythema and tenderness, warmth and pain without throbbing for the last few days.  She was described.  Doxycycline that she took over the last couple days.  She stated that about 10 months ago she had a mosquito bite at the same side that she scratched and the wound healed and she felt a knot under since then.  No fever or chills.  No nausea or vomiting or abdominal pain.  No dysuria, oliguria or hematuria or flank pain no headache or dizziness or blurred vision.  No chest pain or palpitations.  ED Course: When the patient came to the ER, BP was 150/113 with otherwise normal vital signs.  Labs revealed anemia with hemoglobin 11.9 and hematocrit 35.9 compared to previous normal levels in November.  Urine pregnancy test was negative.  Blood cultures were drawn. EKG as reviewed by me : None Imaging: Left breast ultrasound revealed the following: 1. Recommend conservative management of abscess with clinical follow-up to resolution. Ultrasound guided aspiration of the heterogeneous fluid collection at dedicated breast center could be considered. 2. Recommend repeat LEFT breast ultrasound (with LEFT breast mammogram if deemed necessary) in 4 weeks to assess for resolution of a superficial mass at 10 o'clock 15 cm from the nipple.   BI-RADS CATEGORY  3: Probably benign.  The patient was given 1 g of IV  vancomycin .  She will be admitted to a medical bed for further evaluation and management. PAST MEDICAL HISTORY:   Past Medical History:  Diagnosis Date   Allergy    Anemia    Arthritis    Complication of anesthesia    Depression    history   Endometriosis    GERD (gastroesophageal reflux disease)    Headache    history of    History of colonoscopy    Hyperlipidemia    Insulin resistance    PCOS (polycystic ovarian syndrome)    PCOS (polycystic ovarian syndrome)    Pituitary tumor    PONV (postoperative nausea and vomiting)    Urinary incontinence    Uterine fibroid    Vertigo     PAST SURGICAL HISTORY:   Past Surgical History:  Procedure Laterality Date   ANKLE RECONSTRUCTION  03/2000   COLONOSCOPY WITH PROPOFOL  N/A 10/08/2016   Procedure: COLONOSCOPY WITH PROPOFOL ;  Surgeon: Gaylyn Gladis PENNER, MD;  Location[facility] ENDOSCOPY;  Service: Endoscopy;  Laterality: N/A;   DILATION AND CURETTAGE OF UTERUS  07/2005   HYSTEROSCOPY WITH D & C N/A 08/02/2015   Procedure: DILATATION AND CURETTAGE /HYSTEROSCOPY;  Surgeon: Mitzie BROCKS Ward, MD;  Location: [facility] ORS;  Service: Gynecology;  Laterality: N/A;   INTRAUTERINE DEVICE (IUD) INSERTION N/A 08/02/2015   Procedure: INTRAUTERINE DEVICE (IUD) INSERTION;  Surgeon: Mitzie BROCKS Ward, MD;  Location: [facility] ORS;  Service: Gynecology;  Laterality: N/A;   TRANSPHENOIDAL / TRANSNASAL HYPOPHYSECTOMY / RESECTION PITUITARY TUMOR  05/1995  Dr Shlomo   uterine laparoscopy  2004, 2006, 2007    SOCIAL HISTORY:   Social History   Tobacco Use   Smoking status: Former    Current packs/day: 0.00    Types: Cigarettes    Start date: 02/09/1997    Quit date: 02/10/1999    Years since quitting: 24.0   Smokeless tobacco: Never  Substance Use Topics   Alcohol use: No    Alcohol/week: 0.0 standard drinks of alcohol    FAMILY HISTORY:   Family History  Problem Relation Age of Onset   Arthritis Mother    Hyperlipidemia Mother    Hypertension Mother     Alcohol abuse Father    Drug abuse Father    Arthritis Father    Hyperlipidemia Father    Hypertension Father    Mental illness Father    Mental illness Brother    Arthritis Maternal Grandmother    Hyperlipidemia Maternal Grandmother    Hypertension Maternal Grandmother    Diabetes Maternal Grandmother    Arthritis Maternal Grandfather    Hyperlipidemia Maternal Grandfather     DRUG ALLERGIES:   Allergies  Allergen Reactions   Azithromycin Other (See Comments)    Stomach cramping   Coconut (Cocos Nucifera)    Phenergan [Promethazine Hcl] Nausea And Vomiting    REVIEW OF SYSTEMS:   ROS As per history of present illness. All pertinent systems were reviewed above. Constitutional, HEENT, cardiovascular, respiratory, GI, GU, musculoskeletal, neuro, psychiatric, endocrine, integumentary and hematologic systems were reviewed and are otherwise negative/unremarkable except for positive findings mentioned above in the HPI.   MEDICATIONS AT HOME:   Prior to Admission medications   Medication Sig Start Date End Date Taking? Authorizing Provider  acetaminophen  (TYLENOL ) 325 MG tablet Take 650 mg by mouth every 6 (six) hours as needed.    [provider]  ALPRAZolam  (XANAX ) 0.25 MG tablet Take 1 tablet by mouth once daily as needed for anxiety. 09/07/17   Clark, Katherine K, NP  amoxicillin -clavulanate (AUGMENTIN ) 875-125 MG tablet Take 1 tablet by mouth 2 (two) times daily. 12/28/22   Sung, Jade J, MD  HYDROcodone -acetaminophen  (NORCO/VICODIN) 5-325 MG tablet Take 1 tablet by mouth every 6 (six) hours as needed. 06/11/22 06/11/23  Saunders Shona CROME, PA-C  ibuprofen (ADVIL,MOTRIN) 200 MG tablet Take 800 mg by mouth every 8 (eight) hours as needed for mild pain or moderate pain.     [provider]  lansoprazole (PREVACID) 15 MG capsule Take 15 mg by mouth daily at 12 noon.    [provider]  ondansetron  (ZOFRAN -ODT) 4 MG disintegrating tablet Take 1 tablet (4 mg  total) by mouth every 8 (eight) hours as needed for nausea or vomiting. 12/28/22   Sung, Jade J, MD  oxyCODONE -acetaminophen  (PERCOCET/ROXICET) 5-325 MG tablet Take 1 tablet by mouth every 4 (four) hours as needed for severe pain (pain score 7-10). 12/28/22   Robinette Vermell PARAS, MD      VITAL SIGNS:  Blood pressure (!) 146/78, pulse 84, temperature 98.5 F (36.9 C), temperature source Oral, resp. rate 15, height 5' 5 (1.651 m), weight 108.9 kg, SpO2 94%.  PHYSICAL EXAMINATION:  Physical Exam  GENERAL:  53 y.o.-year-old Caucasian female patient lying in the bed with no acute distress.  EYES: Pupils equal, round, reactive to light and accommodation. No scleral icterus. Extraocular muscles intact.  HEENT: Head atraumatic, normocephalic. Oropharynx and nasopharynx clear.  NECK:  Supple, no jugular venous distention. No thyroid enlargement, no tenderness.  LUNGS: Normal  breath sounds bilaterally, no wheezing, rales,rhonchi or crepitation. No use of accessory muscles of respiration.  CARDIOVASCULAR: Regular rate and rhythm, S1, S2 normal. No murmurs, rubs, or gallops.  ABDOMEN: Soft, nondistended, nontender. Bowel sounds present. No organomegaly or mass.  EXTREMITIES: No pedal edema, cyanosis, or clubbing.  NEUROLOGIC: Cranial nerves II through XII are intact. Muscle strength 5/5 in all extremities. Sensation intact. Gait not checked.  PSYCHIATRIC: The patient is alert and oriented x 3.  Normal affect and good eye contact. SKIN: Left upper breast erythema with warmth and tenderness as well as surrounding the wound.     LABORATORY PANEL:   CBC Recent Labs  Lab 02/18/23 0310  WBC 7.8  HGB 11.9*  HCT 35.9*  PLT 192   ------------------------------------------------------------------------------------------------------------------  Chemistries  Recent Labs  Lab 02/18/23 0310  NA 139  K 3.7  CL 103  CO2 26  GLUCOSE 109*  BUN 17  CREATININE 0.80  CALCIUM 8.5*    ------------------------------------------------------------------------------------------------------------------  Cardiac Enzymes No results for input(s): TROPONINI in the last 168 hours. ------------------------------------------------------------------------------------------------------------------  RADIOLOGY:  US  LIMITED ULTRASOUND INCLUDING AXILLA LEFT BREAST  Result Date: 02/17/2023 CLINICAL DATA:  [id number] Abscess of breast, left [id number]. Per provided history, patient underwent attempted I and D in clinic without expression of any significant fluid. EXAM: ULTRASOUND OF THE LEFT BREAST COMPARISON:  Previous exam(s). FINDINGS: Media tab image of physical exam findings were reviewed. There is erythema in the upper breast as well as a skin nick noted in the upper breast. Targeted ultrasound was performed of the site of palpable concern in the LEFT breast near the region of the skin nick. At 10:30 15 cm from the nipple, there is a superficial interdigitating heterogeneous fluid collection. This spans approximately 4.4 x 1.2 by 2.7 cm. There is adjacent edema and hyperemia with overlying skin thickening. Per sonographer report, this area is located subjacent to the skin nick and is approximately 7 mm deep to the skin nick on sonographic images (series 1, image 9). At 10 o'clock 15 cm from the nipple, there is a superficial hypoechoic mass noted subjacent to the skin with a possible tract to the skin surface. This measures 13 x 10 by 13 mm. IMPRESSION: 1. There is a 4.4 cm interdigitating fluid collection in the LEFT breast at 10:30 15 cm from the nipple. This is concerning for abscess. 2. There is a 13 mm superficial hypoechoic mass at 10 o'clock 15 cm from the nipple. This may reflect a small abscess or phlegmon versus superficial mass. Recommend short-term follow-up to assess for resolution. RECOMMENDATION: 1. Recommend conservative management of abscess with clinical follow-up to resolution.  Ultrasound guided aspiration of the heterogeneous fluid collection at dedicated breast center could be considered. 2. Recommend repeat LEFT breast ultrasound (with LEFT breast mammogram if deemed necessary) in 4 weeks to assess for resolution of a superficial mass at 10 o'clock 15 cm from the nipple. BI-RADS CATEGORY  3: Probably benign. Electronically Signed   By: Corean Salter M.D.   On: 02/17/2023 16:40      IMPRESSION AND PLAN:  Assessment and Plan: * Left breast abscess - The patient will be admitted to a medical-surgical bed. - Pain management will be provided. - Will continue antibiotic therapy with IV vancomycin  and Rocephin . - General Surgery consult will be obtained. - Dr. Lane was notified and is aware about the patient.   Anxiety - We will continue Xanax .  GERD without esophagitis Will continue PPI therapy.  Dyslipidemia - This  is apparently currently diet managed.   DVT prophylaxis: Lovenox .  Advanced Care Planning:  Code Status: full code.  Family Communication:  The plan of care was discussed in details with the patient (and family). I answered all questions. The patient agreed to proceed with the above mentioned plan. Further management will depend upon hospital course. Disposition Plan: Back to previous home environment Consults called: General Surgery All the records are reviewed and case discussed with ED provider.  Status is: Inpatient  At the time of the admission, it appears that the appropriate admission status for this patient is inpatient.  This is judged to be reasonable and necessary in order to provide the required intensity of service to ensure the patient's safety given the presenting symptoms, physical exam findings and initial radiographic and laboratory data in the context of comorbid conditions.  The patient requires inpatient status due to high intensity of service, high risk of further deterioration and high frequency of surveillance  required.  I certify that at the time of admission, it is my clinical judgment that the patient will require inpatient hospital care extending more than 2 midnights.                            Dispo: The patient is from: Home              Anticipated d/c is to: Home              Patient currently is not medically stable to d/c.              Difficult to place patient: No  Madison DELENA Peaches M.D on 02/18/2023 at 3:41 AM  [facility]   From 7 PM-7 AM, contact night-coverage [email or web address]  CC: Primary care physician; Sherial Bail, MD

## 2023-02-18 NOTE — Procedures (Signed)
 Interventional Radiology Procedure Note  Procedure: Ultrasound guided left supclavicular chest wall subcutaneous abscess aspiration  Findings: Please refer to procedural dictation for full description. 5 mL sanguinopurulent fluid aspirated from poorly organized subcutaneous fluid collection.  Sample sent for culture.  Complications: None immediate  Estimated Blood Loss: < 5 ml  Recommendations: Agree with continuing antibiotics.   If organized fluid collection returns, could consider ultrasound guided drain placement.   Ester Sides, MD

## 2023-02-18 NOTE — Progress Notes (Signed)
  Progress Note   Patient: Paula Oliver FMW:[id number] DOB: 07/30/1970 DOA: 02/17/2023     1 DOS: the patient was seen and examined on 02/18/2023   Brief hospital course: KAMILA BRODA is a 53 y.o. Caucasian female with medical history significant for osteoarthritis, GERD, depression, dyslipidemia and PCOS, who presented to the emergency room with acute onset of left upper breast swelling with erythema and tenderness, warmth and pain Ultrasound showed a breast abscess, patient was started on antibiotics with vancomycin  and Rocephin .  General surgery consult obtained, recommended IR drain.  Culture will be sent out.   Principal Problem:   Left breast abscess Active Problems:   Anxiety   GERD without esophagitis   Dyslipidemia   Assessment and Plan: * Left breast abscess Continue antibiotics pending IR drain and culture No sepsis.  Obesity with BMI 39.94. Diet and excise.  Anxiety - We will continue Xanax .  GERD without esophagitis Will continue PPI therapy.  Dyslipidemia - This is apparently currently diet managed.       Subjective:  Doing well, no fever or chills.  No nausea vomiting.  Physical Exam: Vitals:   02/17/23 2257 02/18/23 0326 02/18/23 0806 02/18/23 0930  BP:  (!) 146/78 138/63 (!) 143/68  Pulse:  84 75 76  Resp:  15 16 17   Temp:  98.5 F (36.9 C) 97.9 F (36.6 C) 98.3 F (36.8 C)  TempSrc:  Oral Oral Oral  SpO2: 100% 94% 98% 100%  Weight:      Height:       General exam: Appears calm and comfortable  Respiratory system: Clear to auscultation. Respiratory effort normal. Cardiovascular system: S1 & S2 heard, RRR. No JVD, murmurs, rubs, gallops or clicks. No pedal edema. Gastrointestinal system: Abdomen is nondistended, soft and nontender. No organomegaly or masses felt. Normal bowel sounds heard. Central nervous system: Alert and oriented. No focal neurological deficits. Extremities: Symmetric 5 x 5 power. Skin: No rashes, lesions or  ulcers Psychiatry: Judgement and insight appear normal. Mood & affect appropriate.    Data Reviewed:  Ultrasound results and lab results reviewed.  Family Communication: None  Disposition: Status is: Inpatient Remains inpatient appropriate because: Severity of disease, IV treatment, inpatient procedure.     Time spent: 35 minutes  Author: Murvin Mana, MD 02/18/2023 10:53 AM  For on call review [email or web address].

## 2023-02-19 DIAGNOSIS — E669 Obesity, unspecified: Secondary | ICD-10-CM

## 2023-02-19 DIAGNOSIS — L02213 Cutaneous abscess of chest wall: Secondary | ICD-10-CM | POA: Diagnosis not present

## 2023-02-19 DIAGNOSIS — N611 Abscess of the breast and nipple: Secondary | ICD-10-CM | POA: Diagnosis not present

## 2023-02-19 NOTE — Progress Notes (Signed)
 [facility] SURGICAL PROGRESS NOTE (cpt [phone])  Hospital Day(s): 2.    Interval History: Patient seen and examined, no acute events or new complaints overnight. Patient reports she is doing okay. Still with pain but  analgesics are helping. No fever, chills, Erythema slightly improved. No new labs. Patient underwent aspiration with IR yesterday (01/09). Cx from this with GPC in pairs. She is on Rocephin  and Vancomycin .   Review of Systems:  Constitutional: denies fever, chills  HEENT: denies cough or congestion  Respiratory: denies any shortness of breath  Cardiovascular: denies chest pain or palpitations  Gastrointestinal: denies abdominal pain, N/V Genitourinary: denies burning with urination or urinary frequency Musculoskeletal: denies pain, decreased motor or sensation Integumentary: + Left supraclavicular abscess/cellulitis   Vital signs in last 24 hours: [min-max] current  Temp:  [98 F (36.7 C)-98.5 F (36.9 C)] 98 F (36.7 C) (01/10 0759) Pulse Rate:  [62-79] 73 (01/10 0759) Resp:  [17-20] 18 (01/10 0759) BP: (107-144)/(55-76) 132/73 (01/10 0759) SpO2:  [94 %-100 %] 99 % (01/10 0759)     Height: 5' 5 (165.1 cm) Weight: 108.9 kg BMI (Calculated): 39.94   Intake/Output last 2 shifts:  No intake/output data recorded.   Physical Exam:  Constitutional: alert, cooperative and no distress  HENT: normocephalic without obvious abnormality  Eyes: PERRL, EOM's grossly intact and symmetric  Respiratory: breathing non-labored at rest  Cardiovascular: regular rate and sinus rhythm  Integumentary: Still with localized area of erythema to the left supraclavicular region, I do think this is improved some. There remains expected induration. I do not appreciate any areas of fluctuance.    Labs:     Latest Ref Rng & Units 02/18/2023    3:10 AM 12/27/2022    7:35 PM 04/21/2021    3:23 PM  CBC  WBC 4.0 - 10.5 K/uL 7.8  6.7  7.9   Hemoglobin 12.0 - 15.0 g/dL 88.0  86.2   86.1   Hematocrit 36.0 - 46.0 % 35.9  41.7  40.8   Platelets 150 - 400 K/uL 192  219  222       Latest Ref Rng & Units 02/18/2023    3:10 AM 12/27/2022    7:35 PM 04/21/2021    3:23 PM  CMP  Glucose 70 - 99 mg/dL 890  885  830   BUN 6 - 20 mg/dL 17  18  13    Creatinine 0.44 - 1.00 mg/dL 9.19  9.06  9.15   Sodium 135 - 145 mmol/L 139  137  138   Potassium 3.5 - 5.1 mmol/L 3.7  3.8  3.6   Chloride 98 - 111 mmol/L 103  104  105   CO2 22 - 32 mmol/L 26  25  26    Calcium 8.9 - 10.3 mg/dL 8.5  8.7  9.4   Total Protein 6.5 - 8.1 g/dL  7.3  7.5   Total Bilirubin <1.2 mg/dL  0.4  0.6   Alkaline Phos 38 - 126 U/L  71  87   AST 15 - 41 U/L  27  23   ALT 0 - 44 U/L  30  29      Imaging studies: No new pertinent imaging studies   Assessment/Plan: 53 y.o. female with left supclavicular chest wall subcutaneous abscess s/p aspiration on 01/09   - Examination is clinically improving, I do not appreciate any areas of fluctuance, only induration. I do not think she warrants any additional consideration for I&D at this time.  -  Would continue IV Abx another 24 hours; follow up Cx - would recommend 10-14 days Abx at discharge (PO+IV)  - Area can be open to the air; Okay for superficial dressing as needed    - Pain control prn   - Further Management per primary service  - Discharge Planning: clinically improving. I do think it is reasonable to allow another 24 hours of IV Abx to ensure continued improvement. Follow up Cx but can be DC home with PO Abx as above. I will arrange follow up in 7-10 days in the office for recheck    All of the above findings and recommendations were discussed with the patient, and the medical team, and all of patient's questions were answered to her expressed satisfaction.  -- Arthea Platt, PA-C [facility] 02/19/2023, 9:29 AM M-F: 7am - 4pm

## 2023-02-19 NOTE — Progress Notes (Signed)
  Progress Note   Patient: Paula Oliver FMW:[id number] DOB: 1970-03-19 DOA: 02/17/2023     2 DOS: the patient was seen and examined on 02/19/2023   Brief hospital course: Paula Oliver is a 53 y.o. Caucasian female with medical history significant for osteoarthritis, GERD, depression, dyslipidemia and PCOS, who presented to the emergency room with acute onset of left upper breast swelling with erythema and tenderness, warmth and pain Ultrasound showed a breast abscess, patient was started on antibiotics with vancomycin  and Rocephin .  General surgery consult obtained, IR drain performed 9/9.  Initial culture came back with gram-positive cocci in pairs.  Antibiotic continued.   Principal Problem:   Left breast abscess Active Problems:   Anxiety   GERD without esophagitis   Dyslipidemia   Obesity (BMI 30-39.9)   Chest wall abscess   Assessment and Plan: * Left breast abscess Status post IR drain of abscess, still has significant induration in the left breast.  Initial culture grow gram-positive cocci in pairs.  Continue current antibiotics until the culture results available.  Patient most likely will be discharged home tomorrow to follow-up with general surgery in the breast center.   Obesity with BMI 39.94. Diet and excise.   Anxiety - We will continue Xanax .   GERD without esophagitis Will continue PPI therapy.   Dyslipidemia - This is apparently currently diet managed.      Subjective:  Patient has no complaint today.  Physical Exam: Vitals:   02/18/23 2313 02/19/23 0300 02/19/23 0759 02/19/23 1148  BP: (!) 115/55 124/62 132/73 130/78  Pulse: 70 74 73 76  Resp: 20 20 18 19   Temp: 98.1 F (36.7 C) 98 F (36.7 C) 98 F (36.7 C) 98 F (36.7 C)  TempSrc: Oral Oral Oral Oral  SpO2: 95% 94% 99% 99%  Weight:      Height:       General exam: Appears calm and comfortable  Respiratory system: Clear to auscultation. Respiratory effort normal. Cardiovascular system:  S1 & S2 heard, RRR. No JVD, murmurs, rubs, gallops or clicks. No pedal edema. Gastrointestinal system: Abdomen is nondistended, soft and nontender. No organomegaly or masses felt. Normal bowel sounds heard. Central nervous system: Alert and oriented. No focal neurological deficits. Extremities: Symmetric 5 x 5 power. Skin: No rashes, lesions or ulcers Psychiatry: Judgement and insight appear normal. Mood & affect appropriate.  Left breast red, with induration.  Data Reviewed:  Lab results reviewed.  Family Communication: Husband updated at bedside.  Disposition: Status is: Inpatient Remains inpatient appropriate because: Severity of disease, IV treatment.     Time spent: 35 minutes  Author: Murvin Mana, MD 02/19/2023 12:57 PM  For on call review [email or web address].

## 2023-02-20 DIAGNOSIS — K219 Gastro-esophageal reflux disease without esophagitis: Secondary | ICD-10-CM | POA: Diagnosis not present

## 2023-02-20 DIAGNOSIS — N611 Abscess of the breast and nipple: Secondary | ICD-10-CM | POA: Diagnosis not present

## 2023-02-20 DIAGNOSIS — E669 Obesity, unspecified: Secondary | ICD-10-CM | POA: Diagnosis not present

## 2023-02-20 MED ORDER — CLINDAMYCIN HCL 300 MG PO CAPS: 300.0000 mg | ORAL_CAPSULE | Freq: Three times a day (TID) | ORAL | 0 refills | Status: AC

## 2023-02-20 NOTE — Progress Notes (Signed)
 Pt happy to be discharged, says she feels good and will follow up with the breast center.

## 2023-02-20 NOTE — Plan of Care (Signed)

## 2023-02-20 NOTE — Discharge Summary (Signed)
 Physician Discharge Summary   Patient: Paula Oliver MRN: [id number] DOB: Jan 27, 1971  Admit date:     02/17/2023  Discharge date: 02/20/23  Discharge Physician: Murvin Mana   PCP: Sherial Bail, MD   Recommendations at discharge:   Follow-up with general surgery as scheduled.  Discharge Diagnoses: Principal Problem:   Left breast abscess Active Problems:   Anxiety   GERD without esophagitis   Dyslipidemia   Obesity (BMI 30-39.9)   Chest wall abscess  Resolved Problems:   * No resolved hospital problems. [facility] Course: Paula Oliver is a 53 y.o. Caucasian female with medical history significant for osteoarthritis, GERD, depression, dyslipidemia and PCOS, who presented to the emergency room with acute onset of left upper breast swelling with erythema and tenderness, warmth and pain Ultrasound showed a breast abscess, patient was started on antibiotics with vancomycin  and Rocephin .  General surgery consult obtained, IR drain performed 9/9.  Initial culture came back with gram-positive cocci in pairs.  Antibiotic continued with Rocephin  and vancomycin . Wound culture came back today, no growth in 48 hours.  Will continue antibiotics as discussed with per surgery, will start Augmentin  and clindamycin  for 7 days.  Patient be followed by general surgery and breast center in the near future.  Assessment and Plan:  * Left breast abscess Status post IR drain of abscess, still has significant induration in the left breast.  Initial culture grow gram-positive cocci in pairs.  Wound culture came back negative in 48 hours.  Patient will be treated with Augmentin  and clindamycin  and follow-up with general surgery and the breast center in the near future.   Obesity with BMI 39.94. Diet and excise.   Anxiety - We will continue Xanax .   GERD without esophagitis Will continue PPI therapy.   Dyslipidemia - This is apparently currently diet managed.        Consultants:  General Surgery and IR Procedures performed: I&D. Disposition: Home Diet recommendation:  Discharge Diet Orders (From admission, onward)     Start     Ordered   02/20/23 0000  Diet - low sodium heart healthy        02/20/23 0919           Cardiac diet DISCHARGE MEDICATION: Allergies as of 02/20/2023       Reactions   Azithromycin Other (See Comments)   Stomach cramping   Coconut (cocos Nucifera)    Phenergan [promethazine Hcl] Nausea And Vomiting        Medication List     STOP taking these medications    HYDROcodone -acetaminophen  5-325 MG tablet Commonly known as: NORCO/VICODIN   ibuprofen 200 MG tablet Commonly known as: ADVIL       TAKE these medications    acetaminophen  325 MG tablet Commonly known as: TYLENOL  Take 650 mg by mouth every 6 (six) hours as needed.   ALPRAZolam  0.25 MG tablet Commonly known as: XANAX  Take 1 tablet by mouth once daily as needed for anxiety.   amoxicillin -clavulanate 875-125 MG tablet Commonly known as: AUGMENTIN  Take 1 tablet by mouth 2 (two) times daily.   clindamycin  300 MG capsule Commonly known as: Cleocin  Take 1 capsule (300 mg total) by mouth 3 (three) times daily for 7 days.   lansoprazole 15 MG capsule Commonly known as: PREVACID Take 15 mg by mouth daily at 12 noon.   ondansetron  4 MG disintegrating tablet Commonly known as: ZOFRAN -ODT Take 1 tablet (4 mg total) by mouth every 8 (eight) hours as needed  for nausea or vomiting.   oxyCODONE -acetaminophen  5-325 MG tablet Commonly known as: PERCOCET/ROXICET Take 1 tablet by mouth every 4 (four) hours as needed for severe pain (pain score 7-10).               Discharge Care Instructions  (From admission, onward)           Start     Ordered   02/20/23 0000  Discharge wound care:       Comments: Keep clean   02/20/23 0919            Follow-up Information     Lane Shope, MD. Schedule an appointment as soon as possible for a visit  in 1 week(s).   Specialty: General Surgery Why: left chest wall abscess Contact information: [address] [place] [address] [phone]         Imaging, [facility] Follow up in 1 week(s).   Specialty: Diagnostic Radiology Contact information: [address] [place]. [address] [place] [address] [phone]                Discharge Exam: [name]   02/17/23 1523  Weight: 108.9 kg   General exam: Appears calm and comfortable, obese. Respiratory system: Clear to auscultation. Respiratory effort normal. Cardiovascular system: S1 & S2 heard, RRR. No JVD, murmurs, rubs, gallops or clicks. No pedal edema. Gastrointestinal system: Abdomen is nondistended, soft and nontender. No organomegaly or masses felt. Normal bowel sounds heard. Central nervous system: Alert and oriented. No focal neurological deficits. Extremities: Symmetric 5 x 5 power. Skin: No rashes, lesions or ulcers Psychiatry: Judgement and insight appear normal. Mood & affect appropriate.  Left breast redness much improved, induration much smaller in size.  Condition at discharge: good  The results of significant diagnostics from this hospitalization (including imaging, microbiology, ancillary and laboratory) are listed below for reference.   Imaging Studies: US  Abscess Drain Result Date: 02/18/2023 INDICATION: 53 year old female with history of left superior chest wall cellulitis complicated by subcutaneous abscess at site of prior insect bite. EXAM: Ultrasound-guided subcutaneous fluid aspiration MEDICATIONS: The patient is currently admitted to the hospital and receiving intravenous antibiotics. The antibiotics were administered within an appropriate time frame prior to the initiation of the procedure. ANESTHESIA/SEDATION: None. COMPLICATIONS: None immediate. PROCEDURE: Informed written consent was obtained from the patient after a thorough discussion of the procedural  risks, benefits and alternatives. All questions were addressed. Maximal Sterile Barrier Technique was utilized including caps, mask, sterile gowns, sterile gloves, sterile drape, hand hygiene and skin antiseptic. A timeout was performed prior to the initiation of the procedure. Preprocedure ultrasound evaluation of the left subclavicular region area of cellulitis demonstrated a subcutaneous, irregularly-shaped fluid collection compatible with prior breast ultrasound. The procedure was planned. Subdermal Local anesthesia was administered at the planned needle entry site with 1% lidocaine . Deeper local anesthetic was administered under ultrasound visualization to the periphery of the fluid collection. A small skin nick was made. Under direct ultrasound visualization, a 10 cm Yueh catheter was directed into the periphery of the fluid collection. Aspiration was performed yielding approximately 5 mL of sanguinous and purulent fluid. The fluid was sent to the lab for culture. Postprocedure ultrasound evaluation demonstrated trace residual disorganized subcutaneous fluid with resolution of previously visualized collection. No hematoma or other complicating features. A sterile bandage was applied. The patient tolerated the procedure well was transferred to the floor in good condition. IMPRESSION: Technically successful ultrasound-guided left subclavicular subcutaneous abscess  aspiration. Ester Sides, MD [facility] Radiology Electronically Signed   By: Ester Sides M.D.   On: 02/18/2023 16:19   US  LIMITED ULTRASOUND INCLUDING AXILLA LEFT BREAST  Result Date: 02/17/2023 CLINICAL DATA:  [id number] Abscess of breast, left [id number]. Per provided history, patient underwent attempted I and D in clinic without expression of any significant fluid. EXAM: ULTRASOUND OF THE LEFT BREAST COMPARISON:  Previous exam(s). FINDINGS: Media tab image of physical exam findings were reviewed. There  is erythema in the upper breast as well as a skin nick noted in the upper breast. Targeted ultrasound was performed of the site of palpable concern in the LEFT breast near the region of the skin nick. At 10:30 15 cm from the nipple, there is a superficial interdigitating heterogeneous fluid collection. This spans approximately 4.4 x 1.2 by 2.7 cm. There is adjacent edema and hyperemia with overlying skin thickening. Per sonographer report, this area is located subjacent to the skin nick and is approximately 7 mm deep to the skin nick on sonographic images (series 1, image 9). At 10 o'clock 15 cm from the nipple, there is a superficial hypoechoic mass noted subjacent to the skin with a possible tract to the skin surface. This measures 13 x 10 by 13 mm. IMPRESSION: 1. There is a 4.4 cm interdigitating fluid collection in the LEFT breast at 10:30 15 cm from the nipple. This is concerning for abscess. 2. There is a 13 mm superficial hypoechoic mass at 10 o'clock 15 cm from the nipple. This may reflect a small abscess or phlegmon versus superficial mass. Recommend short-term follow-up to assess for resolution. RECOMMENDATION: 1. Recommend conservative management of abscess with clinical follow-up to resolution. Ultrasound guided aspiration of the heterogeneous fluid collection at dedicated breast center could be considered. 2. Recommend repeat LEFT breast ultrasound (with LEFT breast mammogram if deemed necessary) in 4 weeks to assess for resolution of a superficial mass at 10 o'clock 15 cm from the nipple. BI-RADS CATEGORY  3: Probably benign. Electronically Signed   By: Corean Salter M.D.   On: 02/17/2023 16:40    Microbiology: Results for orders placed or performed during the hospital encounter of 02/17/23  Blood Culture (routine x 2)     Status: None (Preliminary result)   Collection Time: 02/17/23 10:46 PM   Specimen: BLOOD  Result Value Ref Range Status   Specimen Description BLOOD LEFT ARM  Final    Special Requests   Final    BOTTLES DRAWN AEROBIC AND ANAEROBIC Blood Culture results may not be optimal due to an inadequate volume of blood received in culture bottles   Culture   Final    NO GROWTH 2 DAYS Performed at [facility], [address] [place], [place] [address]    Report Status PENDING  Incomplete  Blood Culture (routine x 2)     Status: None (Preliminary result)   Collection Time: 02/17/23 10:46 PM   Specimen: BLOOD  Result Value Ref Range Status   Specimen Description BLOOD LEFT ARM  Final   Special Requests   Final    BOTTLES DRAWN AEROBIC AND ANAEROBIC Blood Culture adequate volume   Culture   Final    NO GROWTH 2 DAYS Performed at [facility], [address] [place], [place] [address]    Report Status PENDING  Incomplete  Aerobic/Anaerobic Culture w Gram Stain (surgical/deep wound)     Status: None (Preliminary result)   Collection Time: 02/18/23  3:30  PM   Specimen: Abscess  Result Value Ref Range Status   Specimen Description   Final    ABSCESS Performed at [facility], [address], [place], [place] [address]    Special Requests   Final    LEFT UPPER INNER BREAST Performed at [facility], [address] [place], [place] [address]    Gram Stain   Final    ABUNDANT WBC PRESENT, PREDOMINANTLY PMN RARE GRAM POSITIVE COCCI IN PAIRS    Culture   Final    NO GROWTH 2 DAYS Performed at [facility], [address], [place], [place] [address]    Report Status PENDING  Incomplete    Labs: CBC: Recent Labs  Lab 02/18/23 0310  WBC 7.8  HGB 11.9*  HCT 35.9*  MCV 83.3  PLT 192   Basic Metabolic Panel: Recent Labs  Lab 02/18/23 0310  NA 139  K 3.7  CL 103  CO2 26  GLUCOSE 109*  BUN 17  CREATININE 0.80  CALCIUM 8.5*   Liver Function Tests: No results for input(s): AST, ALT, ALKPHOS, BILITOT, PROT, ALBUMIN in the last 168 hours. CBG: No results for input(s): GLUCAP in the  last 168 hours.  Discharge time spent: greater than 30 minutes.  Signed: Murvin Mana, MD [facility] 02/20/2023

## 2023-02-22 ENCOUNTER — Telehealth: Payer: Self-pay | Admitting: Surgery

## 2023-02-22 ENCOUNTER — Other Ambulatory Visit: Payer: Self-pay | Admitting: Internal Medicine

## 2023-02-22 DIAGNOSIS — N6320 Unspecified lump in the left breast, unspecified quadrant: Secondary | ICD-10-CM

## 2023-02-22 NOTE — Telephone Encounter (Signed)
 Patient has an appointment with Dr. Claudine Mouton 02/25/23 for breast abscess and wants to know if needs to have breast ultrasound done prior or just wait for visit?  Please call patient.

## 2023-02-23 LAB — CULTURE, BLOOD (ROUTINE X 2)
Culture: NO GROWTH
Culture: NO GROWTH
Special Requests: ADEQUATE

## 2023-02-23 LAB — AEROBIC/ANAEROBIC CULTURE W GRAM STAIN (SURGICAL/DEEP WOUND): Culture: NO GROWTH

## 2023-02-25 ENCOUNTER — Ambulatory Visit: Payer: Managed Care, Other (non HMO) | Admitting: Surgery

## 2023-02-25 ENCOUNTER — Other Ambulatory Visit: Payer: Self-pay | Admitting: Surgery

## 2023-02-25 ENCOUNTER — Encounter: Payer: Self-pay | Admitting: Surgery

## 2023-02-25 VITALS — BP 151/90 | HR 93 | Temp 98.1°F | Ht 65.0 in | Wt 237.6 lb

## 2023-02-25 DIAGNOSIS — L02213 Cutaneous abscess of chest wall: Secondary | ICD-10-CM | POA: Diagnosis not present

## 2023-02-25 NOTE — Patient Instructions (Addendum)
Your Ultrasound is scheduled for 03/11/2023 2 pm (arrive at 1:45 pm) at [facility].   Skin Abscess  A skin abscess is an infected area on or under your skin. It contains pus and other material. An abscess may also be called a furuncle, carbuncle, or boil. It is often the result of an infection caused by bacteria. An abscess can occur in or on almost any part of your body. Sometimes, an abscess may break open (rupture) on its own. In most cases, it will keep getting worse unless it is treated. An abscess can cause pain and make you feel ill. An untreated abscess can cause infection to spread to other parts of your body or your bloodstream. The abscess may need to be drained. You may also need to take antibiotics. What are the causes? An abscess occurs when germs, like bacteria, pass through your skin and cause an infection. This may be caused by: A scrape or cut on your skin. A puncture wound through your skin, such as a needle injection or insect bite. Blocked oil or sweat glands. Blocked and infected hair follicles. A fluid-filled sac that forms beneath your skin (sebaceous cyst) and becomes infected. What increases the risk? You may be more likely to develop an abscess if: You have problems with blood circulation, or you have a weak body defense system (immune system). You have diabetes. You have dry and irritated skin. You get injections often or use IV drugs. You have a foreign body in a wound, such as a splinter. You smoke or use tobacco products. What are the signs or symptoms? Symptoms of this condition include: A painful, firm bump under the skin. A bump with pus at the top. This may break through the skin and drain. Other symptoms include: Redness and swelling around the abscess. Warmth or tenderness. Swelling of the lymph nodes (glands) near the abscess. A sore on the skin. How is this diagnosed? This condition may be diagnosed based on a physical exam and your medical  history. You may also have tests done, such as: A test of a sample of pus. This may be done to find what is causing the infection. Blood tests. Imaging tests, such as an ultrasound, CT scan, or MRI. How is this treated? A small abscess that drains on its own may not need to be treated. Treatment for larger abscesses may include: Moist heat or a heat pack applied to the area a few times a day. Incision and drainage. This is a procedure to drain the abscess. Antibiotics. For a severe abscess, you may first get antibiotics through an IV and then change to antibiotics by mouth. Follow these instructions at home: Medicines Take over-the-counter and prescription medicines only as told by your provider. If you were prescribed antibiotics, take them as told by your provider. Do not stop using the antibiotic even if you start to feel better. Abscess care  If you have an abscess that has not drained, apply heat to the affected area. Use the heat source that your provider recommends, such as a moist heat pack or a heating pad. Place a towel between your skin and the heat source. Leave the heat on for 20-30 minutes at a time. If your skin turns bright red, remove the heat right away to prevent burns. The risk of burns is higher if you cannot feel pain, heat, or cold. Follow instructions from your provider about how to take care of your abscess. Make sure you: Cover the  abscess with a bandage (dressing). Wash your hands with soap and water for at least 20 seconds before and after you change the dressing or gauze. If soap and water are not available, use hand sanitizer. Change your dressing or gauze as told by your provider. Check your abscess every day for signs of an infection that is getting worse. Check for: More redness, swelling, pain, or tenderness. More fluid or blood. Warmth. More pus or a worse smell. General instructions To avoid spreading the infection: Do not share personal care items,  towels, or hot tubs with others. Avoid making skin contact with other people. Be careful when getting rid of used dressings, wound packing, or any drainage from the abscess. Do not use any products that contain nicotine or tobacco. These products include cigarettes, chewing tobacco, and vaping devices, such as e-cigarettes. If you need help quitting, ask your provider. Do not use any creams, ointments, or liquids unless you have been told to by your provider. Contact a health care provider if: You see redness that spreads quickly or red streaks on your skin spreading away from the abscess. You have any signs of worse infection at the abscess. You vomit every time you eat or drink. You have a fever, chills, or muscle aches. The cyst or abscess returns. Get help right away if: You have severe pain. You make less pee (urine) than normal. This information is not intended to replace advice given to you by your health care provider. Make sure you discuss any questions you have with your health care provider. Document Revised: 09/10/2021 Document Reviewed: 09/10/2021 Elsevier Patient Education  2024 [employer].

## 2023-02-25 NOTE — Progress Notes (Signed)
Patient ID: Paula Oliver, female   DOB: [birth year], 53 y.o.   MRN: [id number]  Chief Complaint: Left breast, dermal cyst.  History of Present Illness Paula Oliver is a 53 y.o. female with a previously inflamed upper medial quadrant left breast dermal cyst.  Had ultrasound-guided aspiration, responding well to antibiotic treatment.  Currently taking clindamycin.  Culture revealed: MODERATE FINEGOLDIA MAGNA.   She reports diminished swelling, induration and tenderness.  She denies any discharge.  She denies fevers and chills.  She reports the area is diminishing.    Past Medical History Past Medical History:  Diagnosis Date   Allergy    Anemia    Arthritis    Complication of anesthesia    Depression    history   Endometriosis    GERD (gastroesophageal reflux disease)    Headache    history of    History of colonoscopy    Hyperlipidemia    Insulin resistance    PCOS (polycystic ovarian syndrome)    PCOS (polycystic ovarian syndrome)    Pituitary tumor    PONV (postoperative nausea and vomiting)    Urinary incontinence    Uterine fibroid    Vertigo       Past Surgical History:  Procedure Laterality Date   ANKLE RECONSTRUCTION  03/2000   COLONOSCOPY WITH PROPOFOL N/A 10/08/2016   Procedure: COLONOSCOPY WITH PROPOFOL;  Surgeon: Christena Deem, MD;  Location: [facility] ENDOSCOPY;  Service: Endoscopy;  Laterality: N/A;   DILATION AND CURETTAGE OF UTERUS  07/2005   HYSTEROSCOPY WITH D & C N/A 08/02/2015   Procedure: DILATATION AND CURETTAGE /HYSTEROSCOPY;  Surgeon: Elenora Fender Ward, MD;  Location: [facility] ORS;  Service: Gynecology;  Laterality: N/A;   INTRAUTERINE DEVICE (IUD) INSERTION N/A 08/02/2015   Procedure: INTRAUTERINE DEVICE (IUD) INSERTION;  Surgeon: Elenora Fender Ward, MD;  Location: [facility] ORS;  Service: Gynecology;  Laterality: N/A;   TRANSPHENOIDAL / TRANSNASAL HYPOPHYSECTOMY / RESECTION PITUITARY TUMOR  05/1995   Dr Mayford Knife   uterine laparoscopy  2004, 2006, 2007    Allergies   Allergen Reactions   Azithromycin Other (See Comments)    Stomach cramping   Coconut (Cocos Nucifera)    Phenergan [Promethazine Hcl] Nausea And Vomiting    Current Outpatient Medications  Medication Sig Dispense Refill   acetaminophen (TYLENOL) 325 MG tablet Take 650 mg by mouth every 6 (six) hours as needed.     ALPRAZolam (XANAX) 0.25 MG tablet Take 1 tablet by mouth once daily as needed for anxiety. 10 tablet 0   clindamycin (CLEOCIN) 300 MG capsule Take 1 capsule (300 mg total) by mouth 3 (three) times daily for 7 days. 21 capsule 0   fenofibrate (TRICOR) 145 MG tablet Take 1 tablet by mouth daily.     lansoprazole (PREVACID) 15 MG capsule Take 15 mg by mouth daily at 12 noon.     rosuvastatin (CRESTOR) 10 MG tablet Take 1 tablet by mouth daily.     sertraline (ZOLOFT) 50 MG tablet Take 100 mg by mouth daily.     venlafaxine XR (EFFEXOR-XR) 75 MG 24 hr capsule Take 1 capsule by mouth daily.     Vitamin D, Ergocalciferol, (DRISDOL) 1.25 MG (50000 UNIT) CAPS capsule Take 1 capsule by mouth once a week.     No current facility-administered medications for this visit.    Family History Family History  Problem Relation Age of Onset   Arthritis Mother    Hyperlipidemia Mother    Hypertension Mother  Alcohol abuse Father    Drug abuse Father    Arthritis Father    Hyperlipidemia Father    Hypertension Father    Mental illness Father    Mental illness Brother    Arthritis Maternal Grandmother    Hyperlipidemia Maternal Grandmother    Hypertension Maternal Grandmother    Diabetes Maternal Grandmother    Arthritis Maternal Grandfather    Hyperlipidemia Maternal Grandfather       Social History Social History   Tobacco Use   Smoking status: Former    Current packs/day: 0.00    Types: Cigarettes    Start date: 02/09/1997    Quit date: 02/10/1999    Years since quitting: 24.0   Smokeless tobacco: Never  Vaping Use   Vaping status: Never Used  Substance Use Topics    Alcohol use: No    Alcohol/week: 0.0 standard drinks of alcohol   Drug use: No        Review of Systems  Constitutional:  Positive for malaise/fatigue.  HENT: Negative.    Eyes: Negative.   Respiratory: Negative.    Cardiovascular: Negative.   Gastrointestinal:  Positive for heartburn.  Genitourinary: Negative.   Skin: Negative.   Neurological:  Positive for headaches.  Psychiatric/Behavioral: Negative.       Physical Exam Blood pressure (!) 151/90, pulse 93, temperature 98.1 F (36.7 C), temperature source Oral, height 5\' 5"  (1.651 m), weight 237 lb 9.6 oz (107.8 kg), SpO2 98%. Last Weight  Most recent update: 02/25/2023  9:22 AM    Weight  107.8 kg (237 lb 9.6 oz)             CONSTITUTIONAL: Well developed, and nourished, appropriately responsive and aware without distress.   EYES: Sclera non-icteric.   EARS, NOSE, MOUTH AND THROAT:  The oropharynx is clear. Oral mucosa is pink and moist.    Hearing is intact to voice.  NECK: Trachea is midline, and there is no jugular venous distension.  LYMPH NODES:  Lymph nodes in the neck are not appreciated. RESPIRATORY:  Lungs are clear, and breath sounds are equal bilaterally.  Normal respiratory effort without pathologic use of accessory muscles. CARDIOVASCULAR: Heart is regular in rate and rhythm.   Well perfused.  GI: The abdomen is  soft, nontender, and nondistended. There were no palpable masses.  I did not appreciate hepatosplenomegaly.  GU: Marylene Land present as chaperone: Upper medial breast there is a punctum consistent with a likely foci of dermal cyst which is at the 10 o'clock position.  The I&D site at 1030, appears to be healing well.  Between these 2 areas there is still a fairly vague inflammatory process, clearly improved.  No remarkable fluctuance appreciated.  Estimated size of the area is approximately 4 to 5 cm in maximum diameter.  Nontender. MUSCULOSKELETAL:  Symmetrical muscle tone appreciated in all four  extremities.    SKIN: Skin turgor is normal. No pathologic skin lesions appreciated.  NEUROLOGIC:  Motor and sensation appear grossly normal.  Cranial nerves are grossly without defect. PSYCH:  Alert and oriented to person, place and time. Affect is appropriate for situation.  Data Reviewed I have personally reviewed what is currently available of the patient's imaging, recent labs and medical records.   Labs:     Latest Ref Rng & Units 02/18/2023    3:10 AM 12/27/2022    7:35 PM 04/21/2021    3:23 PM  CBC  WBC 4.0 - 10.5 K/uL 7.8  6.7  7.9  Hemoglobin 12.0 - 15.0 g/dL 32.4  40.1  02.7   Hematocrit 36.0 - 46.0 % 35.9  41.7  40.8   Platelets 150 - 400 K/uL 192  219  222       Latest Ref Rng & Units 02/18/2023    3:10 AM 12/27/2022    7:35 PM 04/21/2021    3:23 PM  CMP  Glucose 70 - 99 mg/dL 253  664  403   BUN 6 - 20 mg/dL 17  18  13    Creatinine 0.44 - 1.00 mg/dL 4.74  2.59  5.63   Sodium 135 - 145 mmol/L 139  137  138   Potassium 3.5 - 5.1 mmol/L 3.7  3.8  3.6   Chloride 98 - 111 mmol/L 103  104  105   CO2 22 - 32 mmol/L 26  25  26    Calcium 8.9 - 10.3 mg/dL 8.5  8.7  9.4   Total Protein 6.5 - 8.1 g/dL  7.3  7.5   Total Bilirubin <1.2 mg/dL  0.4  0.6   Alkaline Phos 38 - 126 U/L  71  87   AST 15 - 41 U/L  27  23   ALT 0 - 44 U/L  30  29     Imaging: Radiological images reviewed:  CLINICAL DATA:  Per provided history, patient underwent attempted I and D in clinic without expression of any significant fluid.   EXAM: ULTRASOUND OF THE LEFT BREAST   COMPARISON:  Previous exam(s).   FINDINGS: Media tab image of physical exam findings were reviewed. There is erythema in the upper breast as well as a skin nick noted in the upper breast.   Targeted ultrasound was performed of the site of palpable concern in the LEFT breast near the region of the skin nick. At 10:30 15 cm from the nipple, there is a superficial interdigitating heterogeneous fluid collection. This spans  approximately 4.4 x 1.2 by 2.7 cm. There is adjacent edema and hyperemia with overlying skin thickening. Per sonographer report, this area is located subjacent to the skin nick and is approximately 7 mm deep to the skin nick on sonographic images (series 1, image 9).   At 10 o'clock 15 cm from the nipple, there is a superficial hypoechoic mass noted subjacent to the skin with a possible tract to the skin surface. This measures 13 x 10 by 13 mm.   IMPRESSION: 1. There is a 4.4 cm interdigitating fluid collection in the LEFT breast at 10:30 15 cm from the nipple. This is concerning for abscess. 2. There is a 13 mm superficial hypoechoic mass at 10 o'clock 15 cm from the nipple. This may reflect a small abscess or phlegmon versus superficial mass. Recommend short-term follow-up to assess for resolution.   RECOMMENDATION: 1. Recommend conservative management of abscess with clinical follow-up to resolution. Ultrasound guided aspiration of the heterogeneous fluid collection at dedicated breast center could be considered. 2. Recommend repeat LEFT breast ultrasound (with LEFT breast mammogram if deemed necessary) in 4 weeks to assess for resolution of a superficial mass at 10 o'clock 15 cm from the nipple.   BI-RADS CATEGORY  3: Probably benign.     Electronically Signed   By: Meda Klinefelter M.D.   On: 02/17/2023 16:40 Within last 24 hrs: No results found.  Assessment    Resolving induration, tenderness and hyperemia from upper inner quadrant left breast area.  No evidence of persisting/lingering infection. Patient Active Problem List   Diagnosis  Date Noted   Chest wall abscess 02/19/2023   GERD without esophagitis 02/18/2023   Anxiety 02/18/2023   Obesity (BMI 30-39.9) 02/18/2023   Left breast abscess 02/17/2023   IUD threads lost 10/05/2017   Pelvic pain in female 10/05/2017   Fatigue 09/23/2017   Situational anxiety 01/02/2015   Dyslipidemia 11/28/2013   PCOS  (polycystic ovarian syndrome) 11/28/2013   Neuropathy 11/28/2013   Severe obesity (BMI >= 40) (HCC) 11/28/2013   Preventative health care 11/28/2013   History of pituitary tumor 11/28/2013   Menorrhagia 11/28/2013    Plan    Will complete her 7-day course of clindamycin, she is nearly finished with it.  We will allow further time for resolution of the adjacent induration, and reevaluate her in the interval of a month.  If she has regression, and increased pain, tenderness, we will be glad to see her sooner, may consider reinitiation of antibiotics at that time. As this mass persists consistent with a cystic precursor of this inflammatory event, I will anticipate eventual excision in the OR. We remain readily available, will hopefully she can follow-up as scheduled, however we will be glad to get her worked in should any failure to improve be noted. We will obtain repeat left chest wall/breast ultrasound in the interim.  Face-to-face time spent with the patient and accompanying care providers(if present) was 30 minutes, with more than 50% of the time spent counseling, educating, and coordinating care of the patient.    These notes generated with voice recognition software. I apologize for typographical errors.  Campbell Lerner M.D., FACS 02/25/2023, 12:30 PM

## 2023-03-10 ENCOUNTER — Other Ambulatory Visit: Payer: Self-pay | Admitting: Internal Medicine

## 2023-03-10 DIAGNOSIS — N63 Unspecified lump in unspecified breast: Secondary | ICD-10-CM

## 2023-03-10 DIAGNOSIS — N6320 Unspecified lump in the left breast, unspecified quadrant: Secondary | ICD-10-CM

## 2023-03-10 DIAGNOSIS — Z1231 Encounter for screening mammogram for malignant neoplasm of breast: Secondary | ICD-10-CM

## 2023-03-11 ENCOUNTER — Ambulatory Visit
Admission: RE | Admit: 2023-03-11 | Discharge: 2023-03-11 | Disposition: A | Discharge status: Home / Self Care | Payer: Managed Care, Other (non HMO) | Source: Ambulatory Visit | Attending: Surgery | Admitting: Surgery

## 2023-03-11 DIAGNOSIS — L02213 Cutaneous abscess of chest wall: Secondary | ICD-10-CM | POA: Insufficient documentation

## 2023-03-17 ENCOUNTER — Other Ambulatory Visit: Payer: Managed Care, Other (non HMO)

## 2023-03-18 ENCOUNTER — Ambulatory Visit: Payer: Managed Care, Other (non HMO) | Admitting: Surgery

## 2023-03-18 ENCOUNTER — Encounter: Payer: Self-pay | Admitting: Surgery

## 2023-03-18 ENCOUNTER — Telehealth: Payer: Self-pay | Admitting: Surgery

## 2023-03-18 VITALS — BP 151/90 | HR 101 | Temp 99.3°F | Ht 65.0 in | Wt 232.2 lb

## 2023-03-18 DIAGNOSIS — L02213 Cutaneous abscess of chest wall: Secondary | ICD-10-CM | POA: Diagnosis not present

## 2023-03-18 DIAGNOSIS — N6082 Other benign mammary dysplasias of left breast: Secondary | ICD-10-CM | POA: Insufficient documentation

## 2023-03-18 NOTE — Patient Instructions (Signed)
 Our surgery scheduler Paula Oliver will call you within 24-48 hours to get you scheduled. If you have not heard from her after 48 hours, please call our office. Have the blue sheet available when she calls to write down important information.   You have sutures under the skin that will dissolve and also dermabond (skin glue) on top of your skin which will come off on it's own in 10-14 days.  You may use Ibuprofen or Tylenol  as needed for pain control. Use the ice pack 3-4 times a day for the next two days for any achiness.   Avoid Strenuous activities that will make you sweat during the next 48 hours to avoid the glue coming off prematurely. Avoid activities that will place pressure to this area of the body for 1-2 weeks to avoid re-injury to incision site.   If you have any questions or concerns prior to this appointment, call our office and speak with a nurse.    Excision of Skin Cysts or Lesions Excision of a skin lesion refers to the removal of a section of skin by making small cuts (incisions) in the skin. This procedure may be done to remove a cancerous (malignant) or noncancerous (benign) growth on the skin. It is typically done to treat or prevent cancer or infection. It may also be done to improve cosmetic appearance. The procedure may be done to remove: Cancerous growths, such as basal cell carcinoma, squamous cell carcinoma, or melanoma. Noncancerous growths, such as a cyst or lipoma. Growths, such as moles or skin tags, which may be removed for cosmetic reasons.  Various excision or surgical techniques may be used depending on your condition, the location of the lesion, and your overall health. Tell a health care provider about: Any allergies you have. All medicines you are taking, including vitamins, herbs, eye drops, creams, and over-the-counter medicines. Any problems you or family members have had with anesthetic medicines. Any blood disorders you have. Any surgeries you have  had. Any medical conditions you have. Whether you are pregnant or may be pregnant. What are the risks? Generally, this is a safe procedure. However, problems may occur, including: Bleeding. Infection. Scarring. Recurrence of the cyst, lipoma, or cancer. Changes in skin sensation or appearance, such as discoloration or swelling. Reaction to the anesthetics. Allergic reaction to surgical materials or ointments. Damage to nerves, blood vessels, muscles, or other structures. Continued pain.  What happens before the procedure? Ask your health care provider about: Changing or stopping your regular medicines. This is especially important if you are taking diabetes medicines or blood thinners. Taking medicines such as aspirin and ibuprofen. These medicines can thin your blood. Do not take these medicines before your procedure if your health care provider instructs you not to. You may be asked to take certain medicines. You may be asked to stop smoking. You may have an exam or testing. What happens during the procedure? To reduce your risk of infection: Your health care team will wash or sanitize their hands. Your skin will be washed with soap. You will be given a medicine to numb the area (local anesthetic). One of the following excision techniques will be performed. At the end of any of these procedures, antibiotic ointment will be applied as needed. Each of the following techniques may vary among health care providers and hospitals. Complete Surgical Excision The area of skin that needs to be removed will be marked with a pen. Using a small scalpel or scissors, the surgeon will gently  cut around and under the lesion until it is completely removed. The lesion will be placed in a fluid and sent to the lab for examination. If necessary, bleeding will be controlled with a device that delivers heat (electrocautery). The edges of the wound may be stitched (sutured) together, and a bandage  (dressing) or surgical glue will be applied. This procedure may be performed to treat a cancerous growth or a noncancerous cyst or lesion.  What happens after the procedure? Return to your normal activities as told by your health care provider. Report any excessive bleeding, spreading redness, or increased pain.

## 2023-03-18 NOTE — H&P (View-Only) (Signed)
 Patient ID: Paula Oliver, female   DOB: [birth year], 53 y.o.   MRN: [id number]  Chief Complaint: Left breast, dermal cyst.  History of Present Illness Paula Oliver is a 53 y.o. female with a previously inflamed upper medial quadrant left breast dermal cyst.  Had ultrasound-guided aspiration, has progressed well to antibiotic treatment.  Completed course of clindamycin .  Culture revealed: MODERATE FINEGOLDIA MAGNA.   She reports diminished swelling, induration and tenderness.  She denies any discharge.  She denies fevers and chills.  She reports the area is diminishing.  Ultrasound reviewed.  She would like to proceed with excision of residual cyst.  Past Medical History Past Medical History:  Diagnosis Date   Allergy    Anemia    Arthritis    Complication of anesthesia    Depression    history   Endometriosis    GERD (gastroesophageal reflux disease)    Headache    history of    History of colonoscopy    Hyperlipidemia    Insulin resistance    PCOS (polycystic ovarian syndrome)    PCOS (polycystic ovarian syndrome)    Pituitary tumor    PONV (postoperative nausea and vomiting)    Urinary incontinence    Uterine fibroid    Vertigo       Past Surgical History:  Procedure Laterality Date   ANKLE RECONSTRUCTION  03/2000   COLONOSCOPY WITH PROPOFOL  N/A 10/08/2016   Procedure: COLONOSCOPY WITH PROPOFOL ;  Surgeon: Gaylyn Gladis PENNER, MD;  Location: [facility] ENDOSCOPY;  Service: Endoscopy;  Laterality: N/A;   DILATION AND CURETTAGE OF UTERUS  07/2005   HYSTEROSCOPY WITH D & C N/A 08/02/2015   Procedure: DILATATION AND CURETTAGE /HYSTEROSCOPY;  Surgeon: Mitzie BROCKS Ward, MD;  Location: [facility] ORS;  Service: Gynecology;  Laterality: N/A;   INTRAUTERINE DEVICE (IUD) INSERTION N/A 08/02/2015   Procedure: INTRAUTERINE DEVICE (IUD) INSERTION;  Surgeon: Mitzie BROCKS Ward, MD;  Location: [facility] ORS;  Service: Gynecology;  Laterality: N/A;   TRANSPHENOIDAL / TRANSNASAL HYPOPHYSECTOMY / RESECTION  PITUITARY TUMOR  05/1995   Dr Shlomo   uterine laparoscopy  2004, 2006, 2007    Allergies  Allergen Reactions   Azithromycin Other (See Comments)    Stomach cramping   Coconut (Cocos Nucifera)    Phenergan [Promethazine Hcl] Nausea And Vomiting    Current Outpatient Medications  Medication Sig Dispense Refill   acetaminophen  (TYLENOL ) 325 MG tablet Take 650 mg by mouth every 6 (six) hours as needed.     ALPRAZolam  (XANAX ) 0.25 MG tablet Take 1 tablet by mouth once daily as needed for anxiety. 10 tablet 0   fenofibrate  (TRICOR ) 145 MG tablet Take 1 tablet by mouth daily.     lansoprazole (PREVACID) 15 MG capsule Take 15 mg by mouth daily at 12 noon.     rosuvastatin (CRESTOR) 10 MG tablet Take 1 tablet by mouth daily.     sertraline  (ZOLOFT ) 50 MG tablet Take 100 mg by mouth daily.     venlafaxine  XR (EFFEXOR -XR) 75 MG 24 hr capsule Take 1 capsule by mouth daily.     Vitamin D, Ergocalciferol, (DRISDOL) 1.25 MG (50000 UNIT) CAPS capsule Take 1 capsule by mouth once a week.     No current facility-administered medications for this visit.    Family History Family History  Problem Relation Age of Onset   Arthritis Mother    Hyperlipidemia Mother    Hypertension Mother    Alcohol abuse Father    Drug abuse Father  Arthritis Father    Hyperlipidemia Father    Hypertension Father    Mental illness Father    Mental illness Brother    Arthritis Maternal Grandmother    Hyperlipidemia Maternal Grandmother    Hypertension Maternal Grandmother    Diabetes Maternal Grandmother    Arthritis Maternal Grandfather    Hyperlipidemia Maternal Grandfather       Social History Social History   Tobacco Use   Smoking status: Former    Current packs/day: 0.00    Types: Cigarettes    Start date: 02/09/1997    Quit date: 02/10/1999    Years since quitting: 24.1   Smokeless tobacco: Never  Vaping Use   Vaping status: Never Used  Substance Use Topics   Alcohol use: No    Alcohol/week:  0.0 standard drinks of alcohol   Drug use: No        Review of Systems  Constitutional:  Positive for malaise/fatigue.  HENT: Negative.    Eyes: Negative.   Respiratory: Negative.    Cardiovascular: Negative.   Gastrointestinal:  Positive for heartburn.  Genitourinary: Negative.   Skin: Negative.   Neurological:  Positive for headaches.  Psychiatric/Behavioral: Negative.       Physical Exam Blood pressure (!) 151/90, pulse (!) 101, temperature 99.3 F (37.4 C), temperature source Oral, height 5' 5 (1.651 m), weight 232 lb 3.2 oz (105.3 kg), SpO2 97%. Last Weight  Most recent update: 03/18/2023  9:13 AM    Weight  105.3 kg (232 lb 3.2 oz)              CONSTITUTIONAL: Well developed, and nourished, appropriately responsive and aware without distress.   EYES: Sclera non-icteric.   EARS, NOSE, MOUTH AND THROAT:  The oropharynx is clear. Oral mucosa is pink and moist.    Hearing is intact to voice.  NECK: Trachea is midline, and there is no jugular venous distension.  LYMPH NODES:  Lymph nodes in the neck are not appreciated. RESPIRATORY:  Lungs are clear, and breath sounds are equal bilaterally.  Normal respiratory effort without pathologic use of accessory muscles. CARDIOVASCULAR: Heart is regular in rate and rhythm.   Well perfused.  GI: The abdomen is  soft, nontender, and nondistended. There were no palpable masses.  I did not appreciate hepatosplenomegaly.   Breast: Jon present as chaperone:  Upper medial breast there is a small scar overlying the likely dermal cyst which is at the 10 o'clock position.  No remarkable fluctuance appreciated.  Estimated size of the area is approximately 4 cm in maximum vertically oriented diameter.  Nontender.  MUSCULOSKELETAL:  Symmetrical muscle tone appreciated in all four extremities.    SKIN: Skin turgor is normal. No pathologic skin lesions appreciated.  NEUROLOGIC:  Motor and sensation appear grossly normal.  Cranial nerves are  grossly without defect. PSYCH:  Alert and oriented to person, place and time. Affect is appropriate for situation.  Data Reviewed I have personally reviewed what is currently available of the patient's imaging, recent labs and medical records.   Labs:     Latest Ref Rng & Units 02/18/2023    3:10 AM 12/27/2022    7:35 PM 04/21/2021    3:23 PM  CBC  WBC 4.0 - 10.5 K/uL 7.8  6.7  7.9   Hemoglobin 12.0 - 15.0 g/dL 88.0  86.2  86.1   Hematocrit 36.0 - 46.0 % 35.9  41.7  40.8   Platelets 150 - 400 K/uL 192  219  222  Latest Ref Rng & Units 02/18/2023    3:10 AM 12/27/2022    7:35 PM 04/21/2021    3:23 PM  CMP  Glucose 70 - 99 mg/dL 890  885  830   BUN 6 - 20 mg/dL 17  18  13    Creatinine 0.44 - 1.00 mg/dL 9.19  9.06  9.15   Sodium 135 - 145 mmol/L 139  137  138   Potassium 3.5 - 5.1 mmol/L 3.7  3.8  3.6   Chloride 98 - 111 mmol/L 103  104  105   CO2 22 - 32 mmol/L 26  25  26    Calcium 8.9 - 10.3 mg/dL 8.5  8.7  9.4   Total Protein 6.5 - 8.1 g/dL  7.3  7.5   Total Bilirubin <1.2 mg/dL  0.4  0.6   Alkaline Phos 38 - 126 U/L  71  87   AST 15 - 41 U/L  27  23   ALT 0 - 44 U/L  30  29     Imaging: Radiological images reviewed:  CLINICAL DATA:  Per provided history, patient underwent attempted I and D in clinic without expression of any significant fluid.   EXAM: ULTRASOUND OF THE LEFT BREAST   COMPARISON:  Previous exam(s).   FINDINGS: Media tab image of physical exam findings were reviewed. There is erythema in the upper breast as well as a skin nick noted in the upper breast.   Targeted ultrasound was performed of the site of palpable concern in the LEFT breast near the region of the skin nick. At 10:30 15 cm from the nipple, there is a superficial interdigitating heterogeneous fluid collection. This spans approximately 4.4 x 1.2 by 2.7 cm. There is adjacent edema and hyperemia with overlying skin thickening. Per sonographer report, this area is located subjacent to  the skin nick and is approximately 7 mm deep to the skin nick on sonographic images (series 1, image 9).   At 10 o'clock 15 cm from the nipple, there is a superficial hypoechoic mass noted subjacent to the skin with a possible tract to the skin surface. This measures 13 x 10 by 13 mm.   IMPRESSION: 1. There is a 4.4 cm interdigitating fluid collection in the LEFT breast at 10:30 15 cm from the nipple. This is concerning for abscess. 2. There is a 13 mm superficial hypoechoic mass at 10 o'clock 15 cm from the nipple. This may reflect a small abscess or phlegmon versus superficial mass. Recommend short-term follow-up to assess for resolution.   RECOMMENDATION: 1. Recommend conservative management of abscess with clinical follow-up to resolution. Ultrasound guided aspiration of the heterogeneous fluid collection at dedicated breast center could be considered. 2. Recommend repeat LEFT breast ultrasound (with LEFT breast mammogram if deemed necessary) in 4 weeks to assess for resolution of a superficial mass at 10 o'clock 15 cm from the nipple.   BI-RADS CATEGORY  3: Probably benign.     Electronically Signed   By: Corean Salter M.D.   On: 02/17/2023 16:40  PROCEDURE: US  SOFT TISSUE   HISTORY: Patient is a 53 y/o  F with chest wall abscess.   COMPARISON: .   TECHNIQUE: Two-dimensional grayscale and color Doppler ultrasound of the left chest wall region of interest was performed.   FINDINGS: There is a well circumscribed, complex lesion measuring 3.8 x 2.4 x 1.0 cm identified within the area of patient's concern in the left chest wall. No significant vascularity is identified within  this area.   IMPRESSION: 1. Well-circumscribed complex lesion measuring 3.8 cm is identified within the left chest wall. No significant vascularity is identified. This may represent a hematoma in evolution. Tissue sampling could be considered for further characterization.   Thank you  for allowing us  to assist in the care of this patient.     Electronically Signed   By: Lynwood Mains M.D.   On: 03/12/2023 20:41 Within last 24 hrs: No results found.  Assessment    Left breast/chest wall dermal cyst. Patient Active Problem List   Diagnosis Date Noted   Chest wall abscess 02/19/2023   GERD without esophagitis 02/18/2023   Anxiety 02/18/2023   Obesity (BMI 30-39.9) 02/18/2023   Left breast abscess 02/17/2023   IUD threads lost 10/05/2017   Pelvic pain in female 10/05/2017   Fatigue 09/23/2017   Situational anxiety 01/02/2015   Dyslipidemia 11/28/2013   PCOS (polycystic ovarian syndrome) 11/28/2013   Neuropathy 11/28/2013   Severe obesity (BMI >= 40) (HCC) 11/28/2013   Preventative health care 11/28/2013   History of pituitary tumor 11/28/2013   Menorrhagia 11/28/2013    Plan    Offered to proceed with excision I believe this would be best managed under OR conditions and anesthetic assistance.  Excision of left upper inner quadrant cyst/chest wall dermal cyst.  Risks include but are not limited to anesthesia, bleeding, recurrence, infection, cosmesis issues, etc. Questions answered, no guarantees ever expressed or implied.  Face-to-face time spent with the patient and accompanying care providers(if present) was 20 minutes, with more than 50% of the time spent counseling, educating, and coordinating care of the patient.    These notes generated with voice recognition software. I apologize for typographical errors.  Honor Leghorn M.D., FACS 03/18/2023, 9:49 AM

## 2023-03-18 NOTE — Telephone Encounter (Signed)
 Patient has been advised of Pre-Admission date/time, and Surgery date at [facility].  Surgery Date: 03/24/23 Preadmission Testing Date: 03/22/23 (phone 8a-1p)  Patient has been made aware to call [phone], between 1-3:00pm the day before surgery, to find out what time to arrive for surgery.

## 2023-03-18 NOTE — Progress Notes (Signed)
 Patient ID: Paula Oliver, female   DOB: [birth year], 53 y.o.   MRN: [id number]  Chief Complaint: Left breast, dermal cyst.  History of Present Illness Paula Oliver is a 53 y.o. female with a previously inflamed upper medial quadrant left breast dermal cyst.  Had ultrasound-guided aspiration, has progressed well to antibiotic treatment.  Completed course of clindamycin .  Culture revealed: MODERATE FINEGOLDIA MAGNA.   She reports diminished swelling, induration and tenderness.  She denies any discharge.  She denies fevers and chills.  She reports the area is diminishing.  Ultrasound reviewed.  She would like to proceed with excision of residual cyst.  Past Medical History Past Medical History:  Diagnosis Date   Allergy    Anemia    Arthritis    Complication of anesthesia    Depression    history   Endometriosis    GERD (gastroesophageal reflux disease)    Headache    history of    History of colonoscopy    Hyperlipidemia    Insulin resistance    PCOS (polycystic ovarian syndrome)    PCOS (polycystic ovarian syndrome)    Pituitary tumor    PONV (postoperative nausea and vomiting)    Urinary incontinence    Uterine fibroid    Vertigo       Past Surgical History:  Procedure Laterality Date   ANKLE RECONSTRUCTION  03/2000   COLONOSCOPY WITH PROPOFOL  N/A 10/08/2016   Procedure: COLONOSCOPY WITH PROPOFOL ;  Surgeon: Gaylyn Gladis PENNER, MD;  Location: [facility] ENDOSCOPY;  Service: Endoscopy;  Laterality: N/A;   DILATION AND CURETTAGE OF UTERUS  07/2005   HYSTEROSCOPY WITH D & C N/A 08/02/2015   Procedure: DILATATION AND CURETTAGE /HYSTEROSCOPY;  Surgeon: Mitzie BROCKS Ward, MD;  Location: [facility] ORS;  Service: Gynecology;  Laterality: N/A;   INTRAUTERINE DEVICE (IUD) INSERTION N/A 08/02/2015   Procedure: INTRAUTERINE DEVICE (IUD) INSERTION;  Surgeon: Mitzie BROCKS Ward, MD;  Location: [facility] ORS;  Service: Gynecology;  Laterality: N/A;   TRANSPHENOIDAL / TRANSNASAL HYPOPHYSECTOMY / RESECTION  PITUITARY TUMOR  05/1995   Dr Shlomo   uterine laparoscopy  2004, 2006, 2007    Allergies  Allergen Reactions   Azithromycin Other (See Comments)    Stomach cramping   Coconut (Cocos Nucifera)    Phenergan [Promethazine Hcl] Nausea And Vomiting    Current Outpatient Medications  Medication Sig Dispense Refill   acetaminophen  (TYLENOL ) 325 MG tablet Take 650 mg by mouth every 6 (six) hours as needed.     ALPRAZolam  (XANAX ) 0.25 MG tablet Take 1 tablet by mouth once daily as needed for anxiety. 10 tablet 0   fenofibrate  (TRICOR ) 145 MG tablet Take 1 tablet by mouth daily.     lansoprazole (PREVACID) 15 MG capsule Take 15 mg by mouth daily at 12 noon.     rosuvastatin (CRESTOR) 10 MG tablet Take 1 tablet by mouth daily.     sertraline  (ZOLOFT ) 50 MG tablet Take 100 mg by mouth daily.     venlafaxine  XR (EFFEXOR -XR) 75 MG 24 hr capsule Take 1 capsule by mouth daily.     Vitamin D, Ergocalciferol, (DRISDOL) 1.25 MG (50000 UNIT) CAPS capsule Take 1 capsule by mouth once a week.     No current facility-administered medications for this visit.    Family History Family History  Problem Relation Age of Onset   Arthritis Mother    Hyperlipidemia Mother    Hypertension Mother    Alcohol abuse Father    Drug abuse Father  Arthritis Father    Hyperlipidemia Father    Hypertension Father    Mental illness Father    Mental illness Brother    Arthritis Maternal Grandmother    Hyperlipidemia Maternal Grandmother    Hypertension Maternal Grandmother    Diabetes Maternal Grandmother    Arthritis Maternal Grandfather    Hyperlipidemia Maternal Grandfather       Social History Social History   Tobacco Use   Smoking status: Former    Current packs/day: 0.00    Types: Cigarettes    Start date: 02/09/1997    Quit date: 02/10/1999    Years since quitting: 24.1   Smokeless tobacco: Never  Vaping Use   Vaping status: Never Used  Substance Use Topics   Alcohol use: No    Alcohol/week:  0.0 standard drinks of alcohol   Drug use: No        Review of Systems  Constitutional:  Positive for malaise/fatigue.  HENT: Negative.    Eyes: Negative.   Respiratory: Negative.    Cardiovascular: Negative.   Gastrointestinal:  Positive for heartburn.  Genitourinary: Negative.   Skin: Negative.   Neurological:  Positive for headaches.  Psychiatric/Behavioral: Negative.       Physical Exam Blood pressure (!) 151/90, pulse (!) 101, temperature 99.3 F (37.4 C), temperature source Oral, height 5' 5 (1.651 m), weight 232 lb 3.2 oz (105.3 kg), SpO2 97%. Last Weight  Most recent update: 03/18/2023  9:13 AM    Weight  105.3 kg (232 lb 3.2 oz)              CONSTITUTIONAL: Well developed, and nourished, appropriately responsive and aware without distress.   EYES: Sclera non-icteric.   EARS, NOSE, MOUTH AND THROAT:  The oropharynx is clear. Oral mucosa is pink and moist.    Hearing is intact to voice.  NECK: Trachea is midline, and there is no jugular venous distension.  LYMPH NODES:  Lymph nodes in the neck are not appreciated. RESPIRATORY:  Lungs are clear, and breath sounds are equal bilaterally.  Normal respiratory effort without pathologic use of accessory muscles. CARDIOVASCULAR: Heart is regular in rate and rhythm.   Well perfused.  GI: The abdomen is  soft, nontender, and nondistended. There were no palpable masses.  I did not appreciate hepatosplenomegaly.   Breast: Jon present as chaperone:  Upper medial breast there is a small scar overlying the likely dermal cyst which is at the 10 o'clock position.  No remarkable fluctuance appreciated.  Estimated size of the area is approximately 4 cm in maximum vertically oriented diameter.  Nontender.  MUSCULOSKELETAL:  Symmetrical muscle tone appreciated in all four extremities.    SKIN: Skin turgor is normal. No pathologic skin lesions appreciated.  NEUROLOGIC:  Motor and sensation appear grossly normal.  Cranial nerves are  grossly without defect. PSYCH:  Alert and oriented to person, place and time. Affect is appropriate for situation.  Data Reviewed I have personally reviewed what is currently available of the patient's imaging, recent labs and medical records.   Labs:     Latest Ref Rng & Units 02/18/2023    3:10 AM 12/27/2022    7:35 PM 04/21/2021    3:23 PM  CBC  WBC 4.0 - 10.5 K/uL 7.8  6.7  7.9   Hemoglobin 12.0 - 15.0 g/dL 88.0  86.2  86.1   Hematocrit 36.0 - 46.0 % 35.9  41.7  40.8   Platelets 150 - 400 K/uL 192  219  222  Latest Ref Rng & Units 02/18/2023    3:10 AM 12/27/2022    7:35 PM 04/21/2021    3:23 PM  CMP  Glucose 70 - 99 mg/dL 890  885  830   BUN 6 - 20 mg/dL 17  18  13    Creatinine 0.44 - 1.00 mg/dL 9.19  9.06  9.15   Sodium 135 - 145 mmol/L 139  137  138   Potassium 3.5 - 5.1 mmol/L 3.7  3.8  3.6   Chloride 98 - 111 mmol/L 103  104  105   CO2 22 - 32 mmol/L 26  25  26    Calcium 8.9 - 10.3 mg/dL 8.5  8.7  9.4   Total Protein 6.5 - 8.1 g/dL  7.3  7.5   Total Bilirubin <1.2 mg/dL  0.4  0.6   Alkaline Phos 38 - 126 U/L  71  87   AST 15 - 41 U/L  27  23   ALT 0 - 44 U/L  30  29     Imaging: Radiological images reviewed:  CLINICAL DATA:  Per provided history, patient underwent attempted I and D in clinic without expression of any significant fluid.   EXAM: ULTRASOUND OF THE LEFT BREAST   COMPARISON:  Previous exam(s).   FINDINGS: Media tab image of physical exam findings were reviewed. There is erythema in the upper breast as well as a skin nick noted in the upper breast.   Targeted ultrasound was performed of the site of palpable concern in the LEFT breast near the region of the skin nick. At 10:30 15 cm from the nipple, there is a superficial interdigitating heterogeneous fluid collection. This spans approximately 4.4 x 1.2 by 2.7 cm. There is adjacent edema and hyperemia with overlying skin thickening. Per sonographer report, this area is located subjacent to  the skin nick and is approximately 7 mm deep to the skin nick on sonographic images (series 1, image 9).   At 10 o'clock 15 cm from the nipple, there is a superficial hypoechoic mass noted subjacent to the skin with a possible tract to the skin surface. This measures 13 x 10 by 13 mm.   IMPRESSION: 1. There is a 4.4 cm interdigitating fluid collection in the LEFT breast at 10:30 15 cm from the nipple. This is concerning for abscess. 2. There is a 13 mm superficial hypoechoic mass at 10 o'clock 15 cm from the nipple. This may reflect a small abscess or phlegmon versus superficial mass. Recommend short-term follow-up to assess for resolution.   RECOMMENDATION: 1. Recommend conservative management of abscess with clinical follow-up to resolution. Ultrasound guided aspiration of the heterogeneous fluid collection at dedicated breast center could be considered. 2. Recommend repeat LEFT breast ultrasound (with LEFT breast mammogram if deemed necessary) in 4 weeks to assess for resolution of a superficial mass at 10 o'clock 15 cm from the nipple.   BI-RADS CATEGORY  3: Probably benign.     Electronically Signed   By: Corean Salter M.D.   On: 02/17/2023 16:40  PROCEDURE: US  SOFT TISSUE   HISTORY: Patient is a 53 y/o  F with chest wall abscess.   COMPARISON: .   TECHNIQUE: Two-dimensional grayscale and color Doppler ultrasound of the left chest wall region of interest was performed.   FINDINGS: There is a well circumscribed, complex lesion measuring 3.8 x 2.4 x 1.0 cm identified within the area of patient's concern in the left chest wall. No significant vascularity is identified within  this area.   IMPRESSION: 1. Well-circumscribed complex lesion measuring 3.8 cm is identified within the left chest wall. No significant vascularity is identified. This may represent a hematoma in evolution. Tissue sampling could be considered for further characterization.   Thank you  for allowing us  to assist in the care of this patient.     Electronically Signed   By: Lynwood Mains M.D.   On: 03/12/2023 20:41 Within last 24 hrs: No results found.  Assessment    Left breast/chest wall dermal cyst. Patient Active Problem List   Diagnosis Date Noted   Chest wall abscess 02/19/2023   GERD without esophagitis 02/18/2023   Anxiety 02/18/2023   Obesity (BMI 30-39.9) 02/18/2023   Left breast abscess 02/17/2023   IUD threads lost 10/05/2017   Pelvic pain in female 10/05/2017   Fatigue 09/23/2017   Situational anxiety 01/02/2015   Dyslipidemia 11/28/2013   PCOS (polycystic ovarian syndrome) 11/28/2013   Neuropathy 11/28/2013   Severe obesity (BMI >= 40) (HCC) 11/28/2013   Preventative health care 11/28/2013   History of pituitary tumor 11/28/2013   Menorrhagia 11/28/2013    Plan    Offered to proceed with excision I believe this would be best managed under OR conditions and anesthetic assistance.  Excision of left upper inner quadrant cyst/chest wall dermal cyst.  Risks include but are not limited to anesthesia, bleeding, recurrence, infection, cosmesis issues, etc. Questions answered, no guarantees ever expressed or implied.  Face-to-face time spent with the patient and accompanying care providers(if present) was 20 minutes, with more than 50% of the time spent counseling, educating, and coordinating care of the patient.    These notes generated with voice recognition software. I apologize for typographical errors.  Honor Leghorn M.D., FACS 03/18/2023, 9:49 AM

## 2023-03-19 ENCOUNTER — Ambulatory Visit: Payer: Self-pay | Admitting: Surgery

## 2023-03-19 DIAGNOSIS — N6082 Other benign mammary dysplasias of left breast: Secondary | ICD-10-CM

## 2023-03-22 ENCOUNTER — Other Ambulatory Visit: Payer: Self-pay

## 2023-03-22 ENCOUNTER — Telehealth: Payer: Self-pay | Admitting: Surgery

## 2023-03-22 ENCOUNTER — Encounter
Admission: RE | Admit: 2023-03-22 | Discharge: 2023-03-22 | Disposition: A | Discharge status: Home / Self Care | Payer: Managed Care, Other (non HMO) | Source: Ambulatory Visit | Attending: Surgery | Admitting: Surgery

## 2023-03-22 DIAGNOSIS — L02213 Cutaneous abscess of chest wall: Secondary | ICD-10-CM

## 2023-03-22 DIAGNOSIS — Z01812 Encounter for preprocedural laboratory examination: Secondary | ICD-10-CM

## 2023-03-22 HISTORY — DX: Anxiety disorder, unspecified: F41.9

## 2023-03-22 HISTORY — DX: Solitary cyst of left breast: N60.02

## 2023-03-22 HISTORY — DX: Polyneuropathy, unspecified: G62.9

## 2023-03-22 HISTORY — DX: Sleep apnea, unspecified: G47.30

## 2023-03-22 NOTE — Telephone Encounter (Signed)
 Updated information regarding rescheduled surgery.  Left message for patient to call, please her of the following.   Pre-Admission date/time, and Surgery date at [facility].  Surgery Date: 03/31/23 Preadmission Testing Date: 03/22/23 (phone 8a-1p)  Also patient will need to call at [phone], between 1-3:00pm the day before surgery, to find out what time to arrive for surgery.    Also reminder to patient, hold off on doing Zepbound injection until after her surgery and start back when instructed.

## 2023-03-22 NOTE — Telephone Encounter (Signed)
 Patient calls back, she is informed of information regarding rescheduled surgery.

## 2023-03-22 NOTE — Patient Instructions (Addendum)
 Your procedure is scheduled on: 03/31/2023 [facility] Report to the Registration Desk on the 1st floor of the [facility]. To find out your arrival time, please call [phone] between 1PM - 3PM on: TUESDAY 03/30/23 ,  If your arrival time is 6:00 am, do not arrive before that time as the [facility] entrance doors do not open until 6:00 am.  REMEMBER: Instructions that are not followed completely may result in serious medical risk, up to and including death; or upon the discretion of your surgeon and anesthesiologist your surgery may need to be rescheduled.  Do not eat food or after midnight the night before surgery.  No gum chewing or hard candies.  One week prior to surgery: Stop Anti-inflammatories (NSAIDS) such as Advil, Aleve, Ibuprofen, Motrin, Naproxen, Naprosyn and Aspirin based products such as Excedrin, Goody's Powder, BC Powder. Stop ANY OVER THE COUNTER supplements until after surgery.  You may however, continue to take Tylenol  if needed for pain up until the day of surgery.   DO NOT TAKE Tirzepatide (ZEPBOUND) 2.5 MG/0.5ML Pen 7 days prior to surgery.                         Your surgery is  03/31/2023. Your latest  dose must be FEBRUARY 11th. SEVEN DAYS without it before  surgery.   Continue taking all of your other prescription medications up until the day of surgery.  ON THE DAY OF SURGERY ONLY TAKE THESE MEDICATIONS WITH SIPS OF WATER:  fenofibrate  (TRICOR ) hydrOXYzine (ATARAX) omeprazole (PRILOSEC) sertraline  (ZOLOFT ) venlafaxine  XR (EFFEXOR -XR)       No Alcohol for 24 hours before or after surgery.  No Smoking including e-cigarettes for 24 hours before surgery.  No chewable tobacco products for at least 6 hours before surgery.  No nicotine patches on the day of surgery.  Do not use any "recreational" drugs for at least a week (preferably 2 weeks) before your surgery.  Please be advised that the combination of cocaine and anesthesia may have negative  outcomes, up to and including death. If you test positive for cocaine, your surgery will be cancelled.  On the morning of surgery brush your teeth with toothpaste and water, you may rinse your mouth with mouthwash if you wish. Do not swallow any toothpaste or mouthwash.  Use CHG Soap or wipes as directed on instruction sheet.-provided for you   Do not wear jewelry, make-up, hairpins, clips or nail polish.  For welded (permanent) jewelry: bracelets, anklets, waist bands, etc.  Please have this removed prior to surgery.  If it is not removed, there is a chance that hospital personnel will need to cut it off on the day of surgery.  Do not wear lotions, powders, or perfumes.   Do not shave body hair from the neck down 48 hours before surgery.  Contact lenses, hearing aids and dentures may not be worn into surgery.  Do not bring valuables to the hospital. [facility] is not responsible for any missing/lost belongings or valuables.   Notify your doctor if there is any change in your medical condition (cold, fever, infection).  Wear comfortable clothing (specific to your surgery type) to the hospital.  After surgery, you can help prevent lung complications by doing breathing exercises.  Take deep breaths and cough every 1-2 hours. Your doctor may order a device called an Incentive Spirometer to help you take deep breaths. If you are being admitted to the hospital overnight, leave your  suitcase in the car. After surgery it may be brought to your room.  If you are being discharged the day of surgery, you will not be allowed to drive home. You will need a responsible individual to drive you home and stay with you for 24 hours after surgery.    Please call the Pre-admissions Testing Dept. at [phone] if you have any questions about these instructions.  Surgery Visitation Policy:  Patients having surgery or a procedure may have two visitors.  Children under the age of 88 must have an  adult with them who is not the patient.  Temporary Visitor Restrictions Due to increasing cases of flu, RSV and COVID-19: Children ages 33 and under will not be able to visit patients in [facility] hospitals under most circumstances.  Inpatient Visitation:    Visiting hours are 7 a.m. to 8 p.m. Up to four visitors are allowed at one time in a patient room. The visitors may rotate out with other people during the day.  One visitor age 26 or older may stay with the patient overnight and must be in the room by 8 p.m.       Preparing for Surgery with CHLORHEXIDINE  GLUCONATE (CHG) Soap  Chlorhexidine  Gluconate (CHG) Soap  o An antiseptic cleaner that kills germs and bonds with the skin to continue killing germs even after washing  o Used for showering the night before surgery and morning of surgery  Before surgery, you can play an important role by reducing the number of germs on your skin.  CHG (Chlorhexidine  gluconate) soap is an antiseptic cleanser which kills germs and bonds with the skin to continue killing germs even after washing.  Please do not use if you have an allergy to CHG or antibacterial soaps. If your skin becomes reddened/irritated stop using the CHG.  1. Shower the NIGHT BEFORE SURGERY and the MORNING OF SURGERY with CHG soap.  2. If you choose to wash your hair, wash your hair first as usual with your normal shampoo.  3. After shampooing, rinse your hair and body thoroughly to remove the shampoo.  4. Use CHG as you would any other liquid soap. You can apply CHG directly to the skin and wash gently with a scrungie or a clean washcloth.  5. Apply the CHG soap to your body only from the neck down. Do not use on open wounds or open sores. Avoid contact with your eyes, ears, mouth, and genitals (private parts). Wash face and genitals (private parts) with your normal soap.  6. Wash thoroughly, paying special attention to the area where your surgery will be  performed.  7. Thoroughly rinse your body with warm water.  8. Do not shower/wash with your normal soap after using and rinsing off the CHG soap.  9. Pat yourself dry with a clean towel.  10. Wear clean pajamas to bed the night before surgery.  12. Place clean sheets on your bed the night of your first shower and do not sleep with pets.  13. Shower again with the CHG soap on the day of surgery prior to arriving at the hospital.  14. Do not apply any deodorants/lotions/powders.  15. Please wear clean clothes to the hospital.

## 2023-03-23 ENCOUNTER — Ambulatory Visit
Admission: RE | Admit: 2023-03-23 | Discharge: 2023-03-23 | Disposition: A | Discharge status: Home / Self Care | Payer: Managed Care, Other (non HMO) | Source: Ambulatory Visit | Attending: Internal Medicine | Admitting: Internal Medicine

## 2023-03-23 ENCOUNTER — Other Ambulatory Visit: Payer: Self-pay | Admitting: Surgery

## 2023-03-23 ENCOUNTER — Other Ambulatory Visit: Payer: Self-pay | Admitting: Internal Medicine

## 2023-03-23 DIAGNOSIS — Z1231 Encounter for screening mammogram for malignant neoplasm of breast: Secondary | ICD-10-CM

## 2023-03-23 DIAGNOSIS — N63 Unspecified lump in unspecified breast: Secondary | ICD-10-CM

## 2023-03-23 DIAGNOSIS — N6320 Unspecified lump in the left breast, unspecified quadrant: Secondary | ICD-10-CM

## 2023-03-31 ENCOUNTER — Other Ambulatory Visit: Payer: Self-pay

## 2023-03-31 ENCOUNTER — Ambulatory Visit
Admission: RE | Admit: 2023-03-31 | Discharge: 2023-03-31 | Disposition: A | Discharge status: Home / Self Care | Payer: Managed Care, Other (non HMO) | Attending: Surgery | Admitting: Surgery

## 2023-03-31 ENCOUNTER — Encounter: Payer: Self-pay | Admitting: Surgery

## 2023-03-31 ENCOUNTER — Encounter
Admission: RE | Disposition: A | Discharge status: Home / Self Care | Payer: Self-pay | Source: Home / Self Care | Attending: Surgery

## 2023-03-31 ENCOUNTER — Ambulatory Visit: Payer: Managed Care, Other (non HMO) | Admitting: Anesthesiology

## 2023-03-31 DIAGNOSIS — G4730 Sleep apnea, unspecified: Secondary | ICD-10-CM | POA: Insufficient documentation

## 2023-03-31 DIAGNOSIS — F419 Anxiety disorder, unspecified: Secondary | ICD-10-CM | POA: Diagnosis not present

## 2023-03-31 DIAGNOSIS — F32A Depression, unspecified: Secondary | ICD-10-CM | POA: Diagnosis not present

## 2023-03-31 DIAGNOSIS — N641 Fat necrosis of breast: Secondary | ICD-10-CM | POA: Diagnosis not present

## 2023-03-31 DIAGNOSIS — N6082 Other benign mammary dysplasias of left breast: Secondary | ICD-10-CM

## 2023-03-31 DIAGNOSIS — L02213 Cutaneous abscess of chest wall: Secondary | ICD-10-CM | POA: Diagnosis not present

## 2023-03-31 DIAGNOSIS — N6002 Solitary cyst of left breast: Secondary | ICD-10-CM | POA: Insufficient documentation

## 2023-03-31 DIAGNOSIS — K219 Gastro-esophageal reflux disease without esophagitis: Secondary | ICD-10-CM | POA: Insufficient documentation

## 2023-03-31 DIAGNOSIS — Z01812 Encounter for preprocedural laboratory examination: Secondary | ICD-10-CM

## 2023-03-31 DIAGNOSIS — Z87891 Personal history of nicotine dependence: Secondary | ICD-10-CM | POA: Diagnosis not present

## 2023-03-31 HISTORY — PX: BREAST CYST EXCISION: SHX579

## 2023-03-31 LAB — POCT PREGNANCY, URINE: Preg Test, Ur: NEGATIVE

## 2023-03-31 SURGERY — EXCISION, CYST, BREAST
Anesthesia: General | Laterality: Left

## 2023-03-31 MED ORDER — ORAL CARE MOUTH RINSE: 15.0000 mL | Freq: Once | OROMUCOSAL | Status: AC

## 2023-03-31 MED ORDER — EPHEDRINE SULFATE-NACL 50-0.9 MG/10ML-% IV SOSY
PREFILLED_SYRINGE | INTRAVENOUS | Status: DC | PRN
  Administered 2023-03-31 (×2): 10 mg via INTRAVENOUS
  Administered 2023-03-31: 5 mg via INTRAVENOUS

## 2023-03-31 MED ORDER — PROPOFOL 10 MG/ML IV BOLUS
INTRAVENOUS | Status: AC
  Filled 2023-03-31: qty 20

## 2023-03-31 MED ORDER — ACETAMINOPHEN 500 MG PO TABS
ORAL_TABLET | ORAL | Status: AC
  Filled 2023-03-31: qty 2

## 2023-03-31 MED ORDER — FENTANYL CITRATE (PF) 100 MCG/2ML IJ SOLN
INTRAMUSCULAR | Status: AC
  Filled 2023-03-31: qty 2

## 2023-03-31 MED ORDER — SODIUM CHLORIDE 0.9 % IR SOLN
Status: DC | PRN
  Administered 2023-03-31: 100 mL

## 2023-03-31 MED ORDER — PROPOFOL 10 MG/ML IV BOLUS
INTRAVENOUS | Status: DC | PRN
  Administered 2023-03-31: 130 mg via INTRAVENOUS

## 2023-03-31 MED ORDER — BUPIVACAINE-EPINEPHRINE 0.25% -1:200000 IJ SOLN
INTRAMUSCULAR | Status: DC | PRN
  Administered 2023-03-31: 7 mL

## 2023-03-31 MED ORDER — GLYCOPYRROLATE 0.2 MG/ML IJ SOLN
INTRAMUSCULAR | Status: AC
  Filled 2023-03-31: qty 1

## 2023-03-31 MED ORDER — BUPIVACAINE LIPOSOME 1.3 % IJ SUSP: 20.0000 mL | Freq: Once | INTRAMUSCULAR | Status: DC

## 2023-03-31 MED ORDER — FENTANYL CITRATE (PF) 100 MCG/2ML IJ SOLN: 25.0000 ug | INTRAMUSCULAR | Status: DC | PRN

## 2023-03-31 MED ORDER — ACETAMINOPHEN 500 MG PO TABS
1000.0000 mg | ORAL_TABLET | ORAL | Status: AC
Start: 2023-03-31 — End: 2023-03-31
  Administered 2023-03-31: 1000 mg via ORAL

## 2023-03-31 MED ORDER — CHLORHEXIDINE GLUCONATE CLOTH 2 % EX PADS: 6.0000 | MEDICATED_PAD | Freq: Once | CUTANEOUS | Status: DC

## 2023-03-31 MED ORDER — DEXAMETHASONE SODIUM PHOSPHATE 10 MG/ML IJ SOLN
INTRAMUSCULAR | Status: AC
  Filled 2023-03-31: qty 1

## 2023-03-31 MED ORDER — CELECOXIB 200 MG PO CAPS
200.0000 mg | ORAL_CAPSULE | ORAL | Status: AC
  Administered 2023-03-31: 200 mg via ORAL

## 2023-03-31 MED ORDER — LACTATED RINGERS IV SOLN: INTRAVENOUS | Status: DC

## 2023-03-31 MED ORDER — HYDROCODONE-ACETAMINOPHEN 5-325 MG PO TABS: 1.0000 | ORAL_TABLET | Freq: Four times a day (QID) | ORAL | 0 refills | Status: DC | PRN

## 2023-03-31 MED ORDER — CHLORHEXIDINE GLUCONATE 0.12 % MT SOLN
OROMUCOSAL | Status: AC
Start: 2023-03-31 — End: ?
  Filled 2023-03-31: qty 15

## 2023-03-31 MED ORDER — OXYCODONE HCL 5 MG PO TABS
5.0000 mg | ORAL_TABLET | Freq: Once | ORAL | Status: AC | PRN
  Administered 2023-03-31: 5 mg via ORAL

## 2023-03-31 MED ORDER — CHLORHEXIDINE GLUCONATE 0.12 % MT SOLN
15.0000 mL | Freq: Once | OROMUCOSAL | Status: AC
  Administered 2023-03-31: 15 mL via OROMUCOSAL

## 2023-03-31 MED ORDER — MIDAZOLAM HCL 2 MG/2ML IJ SOLN
INTRAMUSCULAR | Status: DC | PRN
  Administered 2023-03-31: 2 mg via INTRAVENOUS

## 2023-03-31 MED ORDER — LIDOCAINE HCL (CARDIAC) PF 100 MG/5ML IV SOSY
PREFILLED_SYRINGE | INTRAVENOUS | Status: DC | PRN
  Administered 2023-03-31: 50 mg via INTRAVENOUS

## 2023-03-31 MED ORDER — OXYCODONE HCL 5 MG PO TABS
ORAL_TABLET | ORAL | Status: AC
  Filled 2023-03-31: qty 1

## 2023-03-31 MED ORDER — CHLORHEXIDINE GLUCONATE CLOTH 2 % EX PADS
6.0000 | MEDICATED_PAD | Freq: Once | CUTANEOUS | Status: AC
  Administered 2023-03-31: 6 via TOPICAL

## 2023-03-31 MED ORDER — OXYCODONE HCL 5 MG/5ML PO SOLN: 5.0000 mg | Freq: Once | ORAL | Status: AC | PRN

## 2023-03-31 MED ORDER — GLYCOPYRROLATE 0.2 MG/ML IJ SOLN
INTRAMUSCULAR | Status: DC | PRN
  Administered 2023-03-31: .2 mg via INTRAVENOUS

## 2023-03-31 MED ORDER — CEFAZOLIN SODIUM-DEXTROSE 2-4 GM/100ML-% IV SOLN
INTRAVENOUS | Status: AC
  Filled 2023-03-31: qty 100

## 2023-03-31 MED ORDER — GABAPENTIN 300 MG PO CAPS
ORAL_CAPSULE | ORAL | Status: AC
  Filled 2023-03-31: qty 1

## 2023-03-31 MED ORDER — FENTANYL CITRATE (PF) 100 MCG/2ML IJ SOLN
INTRAMUSCULAR | Status: DC | PRN
  Administered 2023-03-31 (×2): 25 ug via INTRAVENOUS

## 2023-03-31 MED ORDER — CEFAZOLIN SODIUM-DEXTROSE 2-4 GM/100ML-% IV SOLN
2.0000 g | INTRAVENOUS | Status: AC
Start: 2023-03-31 — End: 2023-03-31
  Administered 2023-03-31: 2 g via INTRAVENOUS

## 2023-03-31 MED ORDER — SEVOFLURANE IN SOLN
RESPIRATORY_TRACT | Status: AC
  Filled 2023-03-31: qty 250

## 2023-03-31 MED ORDER — EPHEDRINE 5 MG/ML INJ
INTRAVENOUS | Status: AC
  Filled 2023-03-31: qty 5

## 2023-03-31 MED ORDER — DEXAMETHASONE SODIUM PHOSPHATE 10 MG/ML IJ SOLN
INTRAMUSCULAR | Status: DC | PRN
  Administered 2023-03-31: 10 mg via INTRAVENOUS

## 2023-03-31 MED ORDER — CELECOXIB 200 MG PO CAPS
ORAL_CAPSULE | ORAL | Status: AC
  Filled 2023-03-31: qty 1

## 2023-03-31 MED ORDER — MIDAZOLAM HCL 2 MG/2ML IJ SOLN
INTRAMUSCULAR | Status: AC
  Filled 2023-03-31: qty 2

## 2023-03-31 MED ORDER — ONDANSETRON HCL 4 MG/2ML IJ SOLN
INTRAMUSCULAR | Status: AC
  Filled 2023-03-31: qty 2

## 2023-03-31 MED ORDER — GABAPENTIN 300 MG PO CAPS
300.0000 mg | ORAL_CAPSULE | ORAL | Status: AC
  Administered 2023-03-31: 300 mg via ORAL

## 2023-03-31 MED ORDER — ONDANSETRON HCL 4 MG/2ML IJ SOLN
INTRAMUSCULAR | Status: DC | PRN
  Administered 2023-03-31: 4 mg via INTRAVENOUS

## 2023-03-31 MED ORDER — BUPIVACAINE-EPINEPHRINE (PF) 0.25% -1:200000 IJ SOLN
INTRAMUSCULAR | Status: AC
  Filled 2023-03-31: qty 30

## 2023-03-31 MED ORDER — LIDOCAINE HCL (PF) 2 % IJ SOLN
INTRAMUSCULAR | Status: AC
  Filled 2023-03-31: qty 5

## 2023-03-31 SURGICAL SUPPLY — 32 items
APPLIER CLIP 9.375 SM OPEN (CLIP) IMPLANT
BLADE SURG 15 STRL LF DISP TIS (BLADE) ×2 IMPLANT
CHLORAPREP W/TINT 26 (MISCELLANEOUS) ×2 IMPLANT
CLIP APPLIE 9.375 SM OPEN (CLIP) IMPLANT
CNTNR URN SCR LID CUP LEK RST (MISCELLANEOUS) IMPLANT
DERMABOND ADVANCED .7 DNX12 (GAUZE/BANDAGES/DRESSINGS) ×2 IMPLANT
DEVICE DUBIN SPECIMEN MAMMOGRA (MISCELLANEOUS) IMPLANT
DRAPE LAPAROTOMY TRNSV 106X77 (MISCELLANEOUS) ×2 IMPLANT
ELECT CAUTERY BLADE TIP 2.5 (TIP) ×1 IMPLANT
ELECT REM PT RETURN 9FT ADLT (ELECTROSURGICAL) ×1 IMPLANT
ELECTRODE CAUTERY BLDE TIP 2.5 (TIP) ×2 IMPLANT
ELECTRODE REM PT RTRN 9FT ADLT (ELECTROSURGICAL) ×2 IMPLANT
GAUZE 4X4 16PLY ~~LOC~~+RFID DBL (SPONGE) ×2 IMPLANT
GLOVE ORTHO TXT STRL SZ7.5 (GLOVE) ×2 IMPLANT
GOWN STRL REUS W/ TWL LRG LVL3 (GOWN DISPOSABLE) ×2 IMPLANT
GOWN STRL REUS W/ TWL XL LVL3 (GOWN DISPOSABLE) ×2 IMPLANT
KIT MARKER MARGIN INK (KITS) IMPLANT
KIT TURNOVER KIT A (KITS) ×2 IMPLANT
MANIFOLD NEPTUNE II (INSTRUMENTS) ×2 IMPLANT
MARKER MARGIN CORRECT CLIP (MARKER) IMPLANT
NDL HYPO 22X1.5 SAFETY MO (MISCELLANEOUS) ×2 IMPLANT
NEEDLE HYPO 22X1.5 SAFETY MO (MISCELLANEOUS) ×1 IMPLANT
PACK BASIN MINOR ARMC (MISCELLANEOUS) ×2 IMPLANT
SPIKE FLUID TRANSFER (MISCELLANEOUS) ×2 IMPLANT
SUT MNCRL 4-0 27 PS-2 XMFL (SUTURE) ×1 IMPLANT
SUT VIC AB 3-0 SH 27X BRD (SUTURE) ×2 IMPLANT
SUTURE MNCRL 4-0 27XMF (SUTURE) ×2 IMPLANT
SYR 10ML LL (SYRINGE) ×2 IMPLANT
TRAP FLUID SMOKE EVACUATOR (MISCELLANEOUS) ×2 IMPLANT
TRAP NEPTUNE SPECIMEN COLLECT (MISCELLANEOUS) ×2 IMPLANT
WATER STERILE IRR 1000ML POUR (IV SOLUTION) ×2 IMPLANT
WATER STERILE IRR 500ML POUR (IV SOLUTION) ×2 IMPLANT

## 2023-03-31 NOTE — Op Note (Signed)
 Excision of 3.5 cm upper medial quadrant breast/chest wall soft tissue, oil cyst, fat necrosis.  Pre-operative Diagnosis: History of cellulitis with abscess, left medial chest/upper inner quadrant breast.  Post-operative Diagnosis: same, with residual fat necrosis/oil cyst.  Surgeon: Campbell Lerner, M.D., FACS  Assistant: None  Anesthesia: General LMA  Findings: Oily contents of cystic lesion surrounded by evidence of fat necrosis.  Estimated Blood Loss: Less than 5 mL         Specimens: As per findings.  About 3.5 cm area excised.          Complications: none              Procedure Details  The patient was seen again in the Holding Room. The benefits, complications, treatment options, and expected outcomes were discussed with the patient. The risks of bleeding, infection, recurrence of symptoms, failure to resolve symptoms, unanticipated injury, prosthetic placement, prosthetic infection, any of which could require further surgery were reviewed with the patient. The likelihood of improving the patient's symptoms with return to their baseline status is good.  The patient and/or family concurred with the proposed plan, giving informed consent.  The patient was taken to Operating Room, identified and the procedure verified.    Prior to the induction of general anesthesia, antibiotic prophylaxis was administered. VTE prophylaxis was in place.  General anesthesia was then administered and tolerated well. After the induction, the patient was positioned in the supine position and the upper chest wall, left breast and adjacent neck and subclavicular area was prepped with  Chloraprep and draped in the sterile fashion.  A Time Out was held and the above information confirmed.  Local infiltration at the intended point of incision is completed with quarter percent Marcaine with epinephrine.  Incision is made through the previous scar.  Prior to prepping I had evaluated the area with ultrasound  confirming the persistent presence of a cystic structure.  Incision extended down through the skin, I then created cephalad and caudal flaps to ensure complete excision of the lesion.  I then encountered the oily contents with the inferior dissection from under the skin.  Was then able to ensure that I got the entire area of fat necrosis with the cystic lining present for final specimen.  This was removed after sharp dissection, hemostasis obtained with electrocautery.  The wound was then irrigated with normal saline solution.  Hemostasis confirmed.  Vicryl 3-0 utilized for interrupted dermal sutures.  And Monocryl 4-0 utilized for subcuticular closure.  Dermabond applied. Patient tolerated procedure well.  Specimen to pathology.       Campbell Lerner M.D., [facility] 03/31/2023 8:34 AM

## 2023-03-31 NOTE — Anesthesia Postprocedure Evaluation (Signed)
 Anesthesia Post Note  Patient: [name]  Procedure(s) Performed: CYST EXCISION BREAST (Left)  Patient location during evaluation: PACU Anesthesia Type: General Level of consciousness: awake and alert Pain management: pain level controlled Vital Signs Assessment: post-procedure vital signs reviewed and stable Respiratory status: spontaneous breathing, nonlabored ventilation, respiratory function stable and patient connected to nasal cannula oxygen Cardiovascular status: blood pressure returned to baseline and stable Postop Assessment: no apparent nausea or vomiting Anesthetic complications: no   No notable events documented.   Last Vitals:  Vitals:   03/31/23 0900 03/31/23 0910  BP: (!) 148/85 (!) 158/86  Pulse: 88 90  Resp: 19 20  Temp: (!) 36.2 C 36.6 C  SpO2:  100%    Last Pain:  Vitals:   03/31/23 0910  TempSrc: Temporal  PainSc: 0-No pain                 Louie Boston

## 2023-03-31 NOTE — Anesthesia Preprocedure Evaluation (Signed)
 Anesthesia Evaluation  Patient identified by MRN, date of birth, ID band Patient awake    Reviewed: Allergy & Precautions, NPO status , Patient's Chart, lab work & pertinent test results  History of Anesthesia Complications (+) PONV and history of anesthetic complications  Airway Mallampati: III  TM Distance: >3 FB Neck ROM: full    Dental no notable dental hx.    Pulmonary sleep apnea , former smoker   Pulmonary exam normal        Cardiovascular negative cardio ROS Normal cardiovascular exam     Neuro/Psych  Headaches PSYCHIATRIC DISORDERS Anxiety Depression       GI/Hepatic Neg liver ROS,GERD  Medicated,,  Endo/Other  negative endocrine ROS    Renal/GU      Musculoskeletal   Abdominal   Peds  Hematology  (+) Blood dyscrasia, anemia   Anesthesia Other Findings Past Medical History: No date: Allergy No date: Anemia No date: Anxiety No date: Arthritis No date: Breast cyst, left No date: Complication of anesthesia No date: Depression     Comment:  history No date: Endometriosis No date: GERD (gastroesophageal reflux disease) No date: Headache     Comment:  history of  No date: History of colonoscopy No date: Hyperlipidemia No date: Insulin resistance No date: Neuropathy No date: PCOS (polycystic ovarian syndrome) No date: PCOS (polycystic ovarian syndrome) 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: Pituitary tumor No date: PONV (postoperative nausea and vomiting) No date: Sleep apnea No date: Urinary incontinence No date: Uterine fibroid No date: Vertigo  Past Surgical History: 03/2000: ANKLE RECONSTRUCTION 10/08/2016: COLONOSCOPY WITH PROPOFOL; N/A     Comment:  Procedure: COLONOSCOPY WITH PROPOFOL;  Surgeon:               Christena Deem, MD;  Location: [facility] ENDOSCOPY;                Service: Endoscopy;  Laterality: N/A; 07/2005: DILATION AND CURETTAGE OF UTERUS 08/02/2015: HYSTEROSCOPY WITH D & C; N/A     Comment:   Procedure: DILATATION AND CURETTAGE /HYSTEROSCOPY;                Surgeon: Elenora Fender Ward, MD;  Location: [facility] ORS;                Service: Gynecology;  Laterality: N/A; 08/02/2015: INTRAUTERINE DEVICE (IUD) INSERTION; N/A     Comment:  Procedure: INTRAUTERINE DEVICE (IUD) INSERTION;                Surgeon: Elenora Fender Ward, MD;  Location: [facility] ORS;                Service: Gynecology;  Laterality: N/A; No date: PITUITARY EXCISION 05/1995: TRANSPHENOIDAL / TRANSNASAL HYPOPHYSECTOMY / RESECTION  PITUITARY TUMOR     Comment:  Dr Mayford Knife 2004, 2006, 2007: uterine laparoscopy     Reproductive/Obstetrics negative OB ROS                             Anesthesia Physical Anesthesia Plan  ASA: 2  Anesthesia Plan: General LMA   Post-op Pain Management: Tylenol PO (pre-op)*, Celebrex PO (pre-op)* and Gabapentin PO (pre-op)*   Induction: Intravenous  PONV Risk Score and Plan: 4 or greater and Dexamethasone, Ondansetron, Midazolam and Treatment may vary due to age or medical condition  Airway Management Planned: LMA  Additional Equipment:   Intra-op Plan:   Post-operative Plan: Extubation in OR  Informed Consent: I have reviewed the patients History and  Physical, chart, labs and discussed the procedure including the risks, benefits and alternatives for the proposed anesthesia with the patient or authorized representative who has indicated his/her understanding and acceptance.     Dental Advisory Given  Plan Discussed with: Anesthesiologist, CRNA and Surgeon  Anesthesia Plan Comments: (Patient consented for risks of anesthesia including but not limited to:  - adverse reactions to medications - damage to eyes, teeth, lips or other oral mucosa - nerve damage due to positioning  - sore throat or hoarseness - Damage to heart, brain, nerves, lungs, other parts of body or loss of life  Patient voiced understanding and assent.)        Anesthesia Quick  Evaluation

## 2023-03-31 NOTE — Anesthesia Procedure Notes (Signed)
 Procedure Name: LMA Insertion Date/Time: 03/31/2023 7:49 AM  Performed by: Omer Jack, CRNAPre-anesthesia Checklist: Patient identified, Patient being monitored, Timeout performed, Emergency Drugs available and Suction available Patient Re-evaluated:Patient Re-evaluated prior to induction Oxygen Delivery Method: Circle system utilized Preoxygenation: Pre-oxygenation with 100% oxygen Induction Type: IV induction Ventilation: Mask ventilation without difficulty LMA: LMA inserted LMA Size: 4.0 Tube type: Oral Number of attempts: 1 Placement Confirmation: positive ETCO2 and breath sounds checked- equal and bilateral Tube secured with: Tape Dental Injury: Teeth and Oropharynx as per pre-operative assessment

## 2023-03-31 NOTE — Interval H&P Note (Signed)
 History and Physical Interval Note:  03/31/2023 7:39 AM  Paula Oliver  has presented today for surgery, with the diagnosis of Sebaceous cyst of skin left breast.  The various methods of treatment have been discussed with the patient and family. After consideration of risks, benefits and other options for treatment, the patient has consented to  Procedure(s): CYST EXCISION BREAST (Left) as a surgical intervention.  The patient's history has been reviewed, patient examined, no change in status, stable for surgery.  I have reviewed the patient's chart and labs.  Questions were answered to the patient's satisfaction.     Campbell Lerner

## 2023-03-31 NOTE — Transfer of Care (Signed)
 Immediate Anesthesia Transfer of Care Note  Patient: Paula Oliver  Procedure(s) Performed: CYST EXCISION BREAST (Left)  Patient Location: PACU  Anesthesia Type:General  Level of Consciousness: drowsy and patient cooperative  Airway & Oxygen Therapy: Patient Spontanous Breathing and Patient connected to face mask oxygen  Post-op Assessment: Report given to RN and Post -op Vital signs reviewed and stable  Post vital signs: Reviewed and stable  Last Vitals:  Vitals Value Taken Time  BP 130/70 03/31/23 0830  Temp 36.3 C 03/31/23 0830  Pulse 86 03/31/23 0844  Resp 15 03/31/23 0844  SpO2 100 % 03/31/23 0830  Vitals shown include unfiled device data.  Last Pain:  Vitals:   03/31/23 0830  TempSrc:   PainSc: Asleep         Complications: No notable events documented.

## 2023-04-01 ENCOUNTER — Encounter: Payer: Self-pay | Admitting: Surgery

## 2023-04-01 LAB — SURGICAL PATHOLOGY

## 2023-04-14 ENCOUNTER — Encounter: Payer: Self-pay | Admitting: Physician Assistant

## 2023-04-14 ENCOUNTER — Ambulatory Visit (INDEPENDENT_AMBULATORY_CARE_PROVIDER_SITE_OTHER): Payer: Managed Care, Other (non HMO) | Admitting: Physician Assistant

## 2023-04-14 VITALS — BP 142/80 | HR 81 | Temp 98.5°F | Ht 65.0 in | Wt 229.0 lb

## 2023-04-14 DIAGNOSIS — N6082 Other benign mammary dysplasias of left breast: Secondary | ICD-10-CM | POA: Diagnosis not present

## 2023-04-14 DIAGNOSIS — L02213 Cutaneous abscess of chest wall: Secondary | ICD-10-CM | POA: Diagnosis not present

## 2023-04-14 DIAGNOSIS — Z09 Encounter for follow-up examination after completed treatment for conditions other than malignant neoplasm: Secondary | ICD-10-CM

## 2023-04-14 NOTE — Patient Instructions (Signed)

## 2023-04-14 NOTE — Progress Notes (Signed)
 [facility] POST-OP OFFICE VISIT  04/14/2023  HPI: Paula Oliver is a 53 y.o. female s/p excision of 3.5 cm upper medial quadrant breast/chest wall soft tissue, oil cyst, fat necrosis on 02/19 with Dr Claudine Mouton .   She has done well 2-3 days of soreness No fever, chills Incision has healed well; no erythema or drainage  No other complaints   Vital signs: BP (!) 142/80   Pulse 81   Temp 98.5 F (36.9 C) (Oral)   Ht 5\' 5"  (1.651 m)   Wt 229 lb (103.9 kg)   SpO2 97%   BMI 38.11 kg/m    Physical Exam: Constitutional: Well appearing female, NAD Skin: Well healed 3 cm incision to the left upper chest, no erythema, no swelling   Assessment/Plan: This is a 53 y.o. female s/p excision of 3.5 cm upper medial quadrant breast/chest wall soft tissue, oil cyst, fat necrosis on 02/19 with Dr Claudine Mouton .    - Pain control prn  - Reviewed wound care recommendation  - Reviewed surgical pathology; ruptured cyst  - She can follow up on as needed basis; She understands to call with questions/concerns  -- Lynden Oxford, PA-C [facility] 04/14/2023, 2:53 PM M-F: 7am - 4pm

## 2023-06-14 ENCOUNTER — Emergency Department
Admission: EM | Admit: 2023-06-14 | Discharge: 2023-06-15 | Disposition: A | Discharge status: Home / Self Care | Attending: Emergency Medicine | Admitting: Emergency Medicine

## 2023-06-14 ENCOUNTER — Encounter: Payer: Self-pay | Admitting: *Deleted

## 2023-06-14 ENCOUNTER — Emergency Department

## 2023-06-14 ENCOUNTER — Other Ambulatory Visit: Payer: Self-pay

## 2023-06-14 DIAGNOSIS — K5732 Diverticulitis of large intestine without perforation or abscess without bleeding: Secondary | ICD-10-CM | POA: Diagnosis not present

## 2023-06-14 DIAGNOSIS — K5792 Diverticulitis of intestine, part unspecified, without perforation or abscess without bleeding: Secondary | ICD-10-CM

## 2023-06-14 DIAGNOSIS — R1032 Left lower quadrant pain: Secondary | ICD-10-CM | POA: Diagnosis present

## 2023-06-14 LAB — URINALYSIS, ROUTINE W REFLEX MICROSCOPIC
Bilirubin Urine: NEGATIVE
Glucose, UA: NEGATIVE mg/dL
Hgb urine dipstick: NEGATIVE
Ketones, ur: NEGATIVE mg/dL
Leukocytes,Ua: NEGATIVE
Nitrite: NEGATIVE
Protein, ur: NEGATIVE mg/dL
Specific Gravity, Urine: 1.026 (ref 1.005–1.030)
pH: 6 (ref 5.0–8.0)

## 2023-06-14 LAB — COMPREHENSIVE METABOLIC PANEL WITH GFR
ALT: 19 U/L (ref 0–44)
AST: 20 U/L (ref 15–41)
Albumin: 4.4 g/dL (ref 3.5–5.0)
Alkaline Phosphatase: 86 U/L (ref 38–126)
Anion gap: 9 (ref 5–15)
BUN: 12 mg/dL (ref 6–20)
CO2: 25 mmol/L (ref 22–32)
Calcium: 9.1 mg/dL (ref 8.9–10.3)
Chloride: 106 mmol/L (ref 98–111)
Creatinine, Ser: 0.76 mg/dL (ref 0.44–1.00)
GFR, Estimated: >60 mL/min (ref >60)
Glucose, Bld: 104 mg/dL — ABNORMAL HIGH (ref 70–99)
Potassium: 3.8 mmol/L (ref 3.5–5.1)
Sodium: 140 mmol/L (ref 135–145)
Total Bilirubin: 0.6 mg/dL (ref 0.0–1.2)
Total Protein: 7.6 g/dL (ref 6.5–8.1)

## 2023-06-14 LAB — CBC
HCT: 41.4 % (ref 36.0–46.0)
Hemoglobin: 13.8 g/dL (ref 12.0–15.0)
MCH: 27.9 pg (ref 26.0–34.0)
MCHC: 33.3 g/dL (ref 30.0–36.0)
MCV: 83.6 fL (ref 80.0–100.0)
Platelets: 208 10*3/uL (ref 150–400)
RBC: 4.95 MIL/uL (ref 3.87–5.11)
RDW: 13.7 % (ref 11.5–15.5)
WBC: 8.6 10*3/uL (ref 4.0–10.5)
nRBC: 0 % (ref 0.0–0.2)

## 2023-06-14 LAB — LIPASE, BLOOD: Lipase: 27 U/L (ref 11–51)

## 2023-06-14 MED ORDER — IOHEXOL 300 MG/ML  SOLN
100.0000 mL | Freq: Once | INTRAMUSCULAR | Status: AC | PRN
  Administered 2023-06-14: 100 mL via INTRAVENOUS

## 2023-06-14 NOTE — ED Triage Notes (Signed)
 Pt ambulatory to triage.  Pt has lower abd pain with vomiting and also reports constipation.  Hx diverticulitis.  No vag bleeding.  Pt denies urinary sx.  Pt alert.

## 2023-06-15 MED ORDER — ONDANSETRON HCL 4 MG/2ML IJ SOLN
4.0000 mg | Freq: Once | INTRAMUSCULAR | Status: AC
  Administered 2023-06-15: 4 mg via INTRAVENOUS
  Filled 2023-06-15: qty 2

## 2023-06-15 MED ORDER — POLYETHYLENE GLYCOL 3350 17 G PO PACK
17.0000 g | PACK | Freq: Two times a day (BID) | ORAL | 0 refills | Status: AC
Start: 2023-06-15 — End: 2023-07-15

## 2023-06-15 MED ORDER — SODIUM CHLORIDE 0.9 % IV BOLUS
500.0000 mL | Freq: Once | INTRAVENOUS | Status: AC
  Administered 2023-06-15: 500 mL via INTRAVENOUS

## 2023-06-15 MED ORDER — MORPHINE SULFATE (PF) 4 MG/ML IV SOLN
4.0000 mg | Freq: Once | INTRAVENOUS | Status: DC
  Filled 2023-06-15: qty 1

## 2023-06-15 MED ORDER — SODIUM CHLORIDE 0.9 % IV SOLN
3.0000 g | Freq: Once | INTRAVENOUS | Status: AC
  Administered 2023-06-15: 3 g via INTRAVENOUS
  Filled 2023-06-15: qty 8

## 2023-06-15 MED ORDER — AMOXICILLIN-POT CLAVULANATE 875-125 MG PO TABS: 1.0000 | ORAL_TABLET | Freq: Two times a day (BID) | ORAL | 0 refills | Status: AC

## 2023-06-15 MED ORDER — KETOROLAC TROMETHAMINE 15 MG/ML IJ SOLN
15.0000 mg | Freq: Once | INTRAMUSCULAR | Status: AC
  Administered 2023-06-15: 15 mg via INTRAVENOUS
  Filled 2023-06-15: qty 1

## 2023-06-15 NOTE — ED Provider Notes (Signed)
 [facility] Provider Note    Event Date/Time   First MD Initiated Contact with Patient 06/14/23 2329     (approximate)   History   Abdominal Pain   HPI  SHANNARA NIGHT is a 53 y.o. female   Past medical history of diverticulitis, PCOS, who presents to the Emergency Department with left lower quadrant pain reminiscent of her diverticulitis bouts in the past.  Last couple of days of symptoms.  No urinary symptoms.  No vaginal bleeding or discharge.  She has some vomiting/nausea and also constipation.  She denies fevers or chills.   External Medical Documents Reviewed: Surgery note from February for cellulitis and abscess      Physical Exam   Triage Vital Signs: ED Triage Vitals  Encounter Vitals Group     BP 06/14/23 1550 (!) 177/96     Systolic BP Percentile --      Diastolic BP Percentile --      Pulse Rate 06/14/23 1550 (!) 106     Resp 06/14/23 1550 18     Temp 06/14/23 1550 98.5 F (36.9 C)     Temp Source 06/14/23 1550 Oral     SpO2 06/14/23 1550 99 %     Weight 06/14/23 1547 222 lb (100.7 kg)     Height 06/14/23 1547 5\' 5"  (1.651 m)     Head Circumference --      Peak Flow --      Pain Score 06/14/23 1547 5     Pain Loc --      Pain Education --      Exclude from Growth Chart --     Most recent vital signs: Vitals:   06/14/23 1550 06/14/23 2209  BP: (!) 177/96 (!) 193/90  Pulse: (!) 106 94  Resp: 18 18  Temp: 98.5 F (36.9 C) 98.2 F (36.8 C)  SpO2: 99% 100%    General: Awake, no distress.  CV:  Good peripheral perfusion.  Resp:  Normal effort. Abd:  No distention.  Other:  Awake alert comfortable slightly hypertensive otherwise vital signs normal.  Pleasant woman in no acute distress.  Palpation of the abdomen elicits some mild tenderness on the left lower side without rigidity or guarding.   ED Results / Procedures / Treatments   Labs (all labs ordered are listed, but only abnormal results are displayed) Labs  Reviewed  COMPREHENSIVE METABOLIC PANEL WITH GFR - Abnormal; Notable for the following components:      Result Value   Glucose, Bld 104 (*)    All other components within normal limits  URINALYSIS, ROUTINE W REFLEX MICROSCOPIC - Abnormal; Notable for the following components:   Color, Urine YELLOW (*)    APPearance HAZY (*)    All other components within normal limits  LIPASE, BLOOD  CBC     I ordered and reviewed the above labs they are notable for white blood cell count normal urinalysis without bacteria or inflammatory changes.    RADIOLOGY I independently reviewed and interpreted CT scan I see no obvious obstructive changes or marked inflammatory changes or fluid collection I also reviewed radiologist's formal read.   PROCEDURES:  Critical Care performed: No  Procedures   MEDICATIONS ORDERED IN ED: Medications  morphine  (PF) 4 MG/ML injection 4 mg (4 mg Intravenous Not Given 06/15/23 0029)  iohexol  (OMNIPAQUE ) 300 MG/ML solution 100 mL (100 mLs Intravenous Contrast Given 06/14/23 2218)  ketorolac  (TORADOL ) 15 MG/ML injection 15 mg (15 mg Intravenous Given  06/15/23 0029)  sodium chloride  0.9 % bolus 500 mL (0 mLs Intravenous Stopped 06/15/23 0123)  ondansetron  (ZOFRAN ) injection 4 mg (4 mg Intravenous Given 06/15/23 0029)  Ampicillin-Sulbactam (UNASYN) 3 g in sodium chloride  0.9 % 100 mL IVPB (0 g Intravenous Stopped 06/15/23 0123)     IMPRESSION / MDM / ASSESSMENT AND PLAN / ED COURSE  I reviewed the triage vital signs and the nursing notes.                                Patient's presentation is most consistent with acute presentation with potential threat to life or bodily function.  Differential diagnosis includes, but is not limited to, diverticulitis, obstruction, appendicitis, urinary tract infection   The patient is on the cardiac monitor to evaluate for evidence of arrhythmia and/or significant heart rate changes.  MDM:    This patient with history of  diverticulitis and mild stranding in the left lower quadrant CT scan that is consistent with early uncomplicated diverticulitis.  Given her degree of pain we will give antibiotics.  Also give her prescription for MiraLAX given her constipation.    I considered hospitalization for admission or observation however given her stable condition, uncomplicated diverticulitis on CT scan, low concern for sepsis, I think she can be discharged on antibiotics and follow-up with her primary doctor/surgeon as needed.        FINAL CLINICAL IMPRESSION(S) / ED DIAGNOSES   Final diagnoses:  Diverticulitis     Rx / DC Orders   ED Discharge Orders          Ordered    amoxicillin -clavulanate (AUGMENTIN ) 875-125 MG tablet  2 times daily        06/15/23 0010    polyethylene glycol (MIRALAX) 17 g packet  2 times daily        06/15/23 0010             Note:  This document was prepared using Dragon voice recognition software and may include unintentional dictation errors.    Buell Carmin, MD 06/15/23 [phone]

## 2023-06-15 NOTE — Discharge Instructions (Signed)
 Take antibiotics for the full 7-day course as prescribed.  Take MiraLAX twice daily for constipation relief.   Thank you for choosing us  for your health care today!  Please see your primary doctor this week for a follow up appointment.   If you have any new, worsening, or unexpected symptoms call your doctor right away or come back to the emergency department for reevaluation.  It was my pleasure to care for you today.   Arron Large Margery Sheets, MD

## 2023-06-29 ENCOUNTER — Encounter (INDEPENDENT_AMBULATORY_CARE_PROVIDER_SITE_OTHER): Payer: Self-pay

## 2024-01-06 IMAGING — CT CT ABD-PELV W/ CM
2 of 5 series · 16 of 46 positions shown, 18 images · IV contrast (APPLIED)
Comparison: 10/06/2016

CLINICAL DATA: Left lower quadrant abdominal pain, history of
diverticulitis

EXAM:
CT ABDOMEN AND PELVIS WITH CONTRAST
TECHNIQUE: Multidetector CT imaging of the abdomen and pelvis was performed
using the standard protocol following bolus administration of
intravenous contrast.

[Series 3: abdomen 5.0 · axial · 0.83mm/px · z∈[+779,+1179]mm · 13 of 94 slices shown, 15 images]
[im 7/94  soft-tissue]
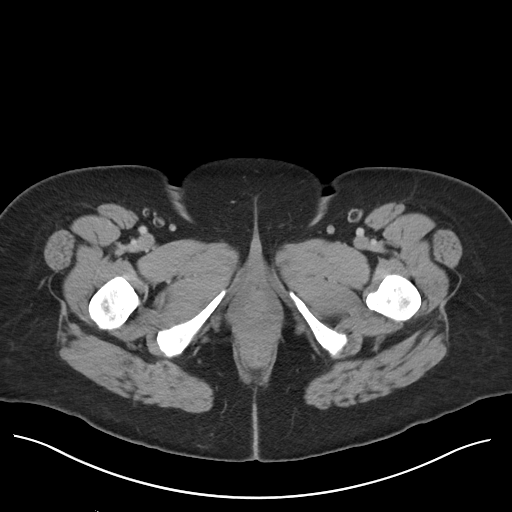
[im 7/94  bone]
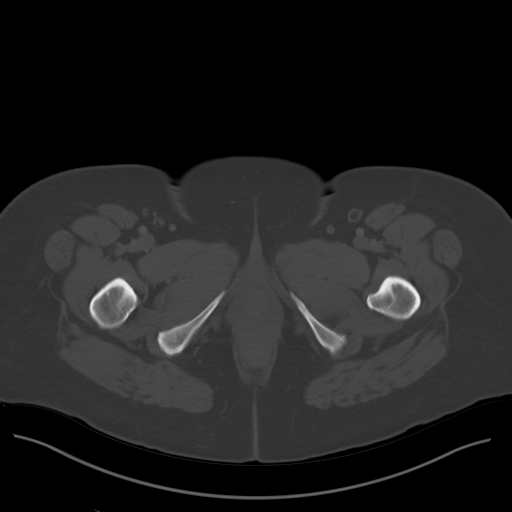
[im 13/94  soft-tissue]
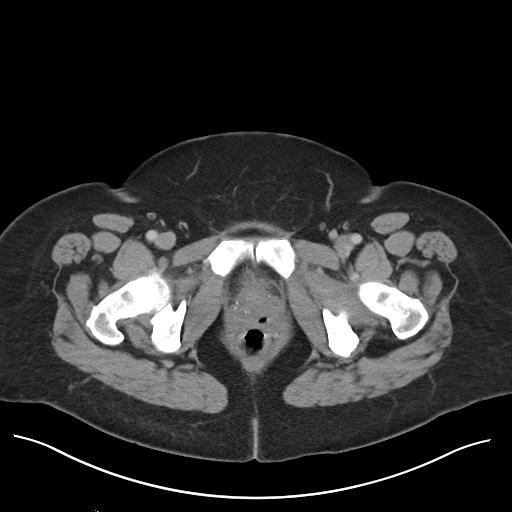
[im 19/94  soft-tissue]
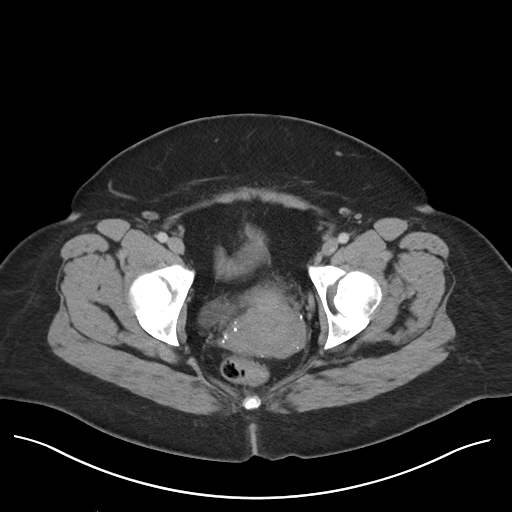
[im 25/94  soft-tissue]
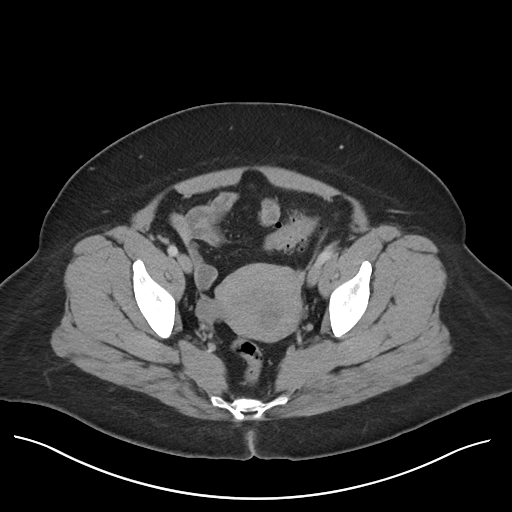
[im 32/94  soft-tissue]
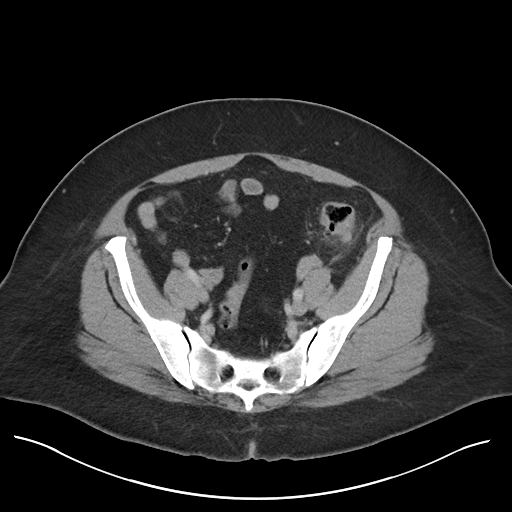
[im 38/94  soft-tissue]
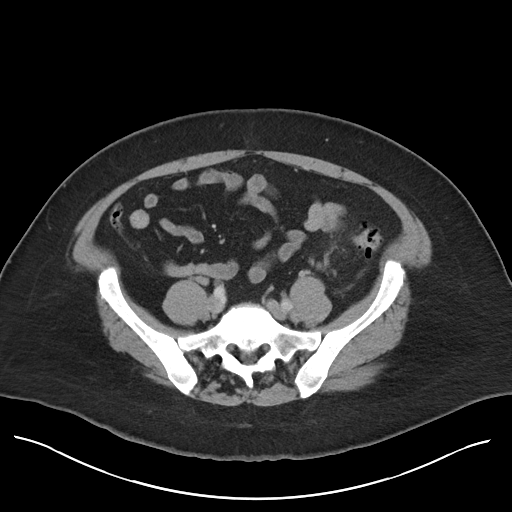
[im 50/94  soft-tissue]
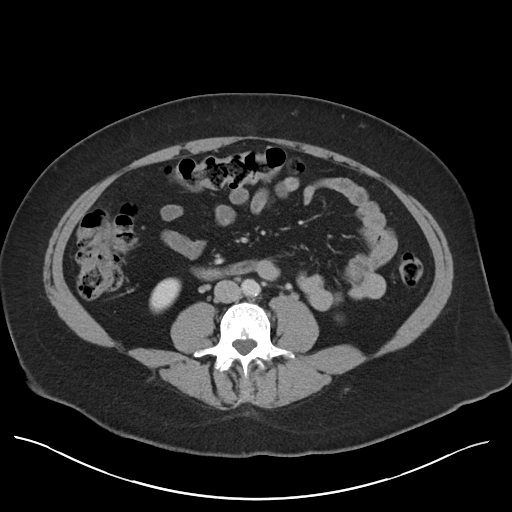
[im 56/94  soft-tissue]
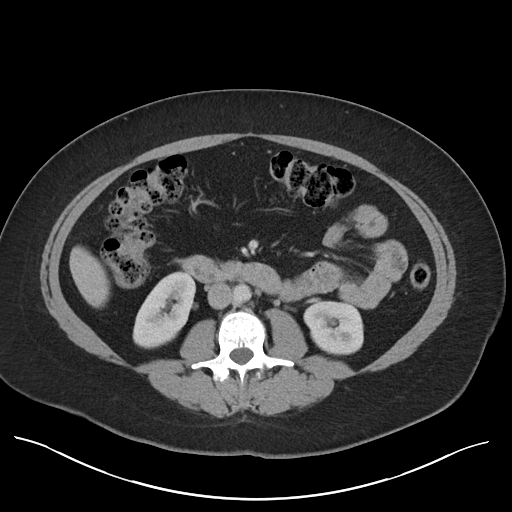
[im 63/94  soft-tissue]
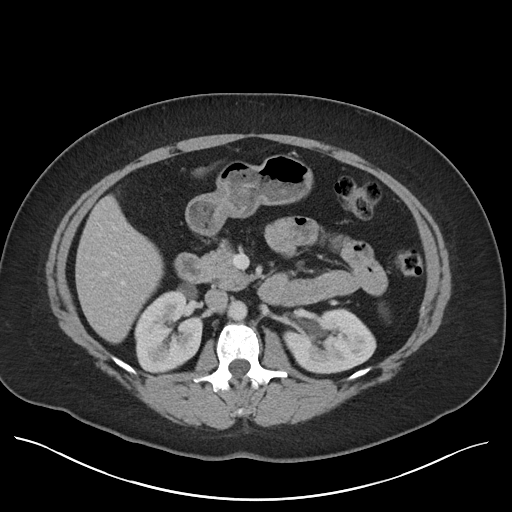
[im 63/94  bone]
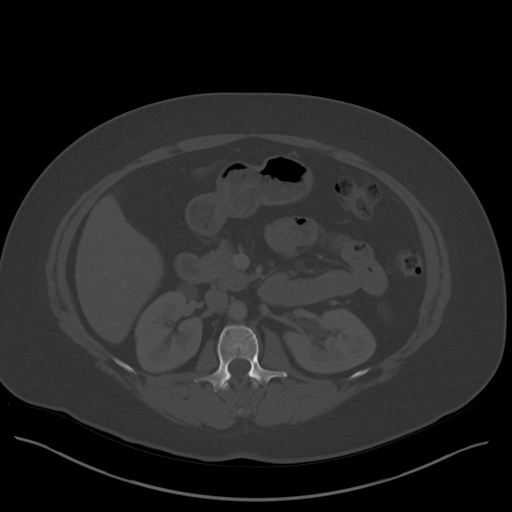
[im 69/94  soft-tissue]
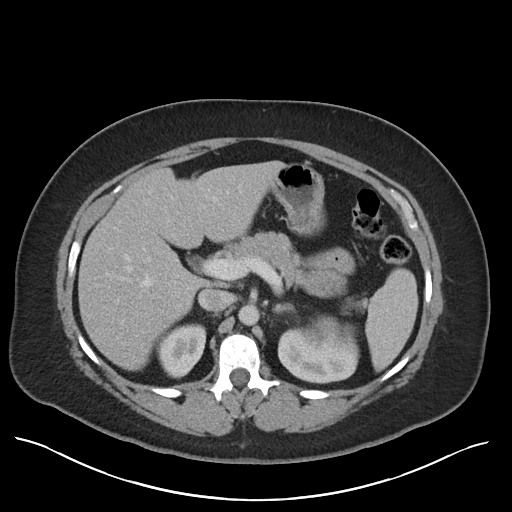
[im 75/94  soft-tissue]
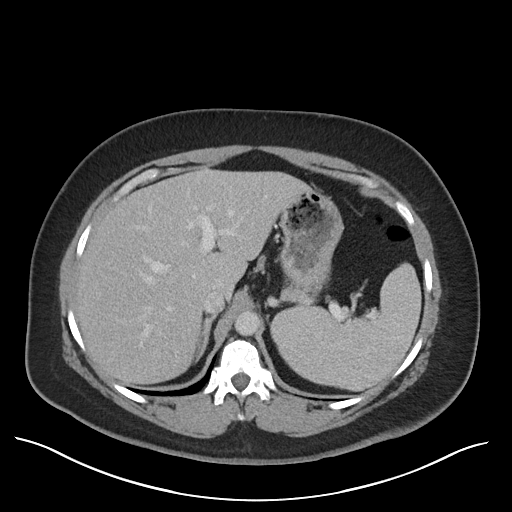
[im 81/94  soft-tissue]
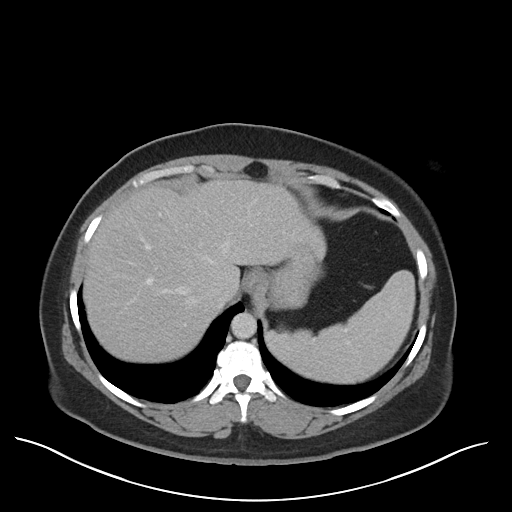
[im 87/94  soft-tissue]
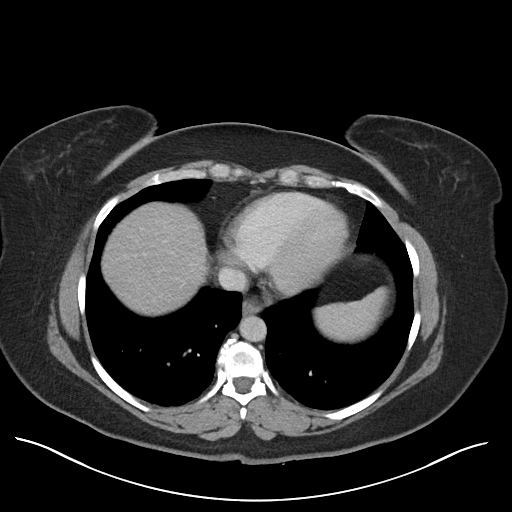

[Series 6: abdomen 3.0 mpr cor · coronal · 0.89mm/px · 3 of 106 slices shown]
[im 36/106  soft-tissue]
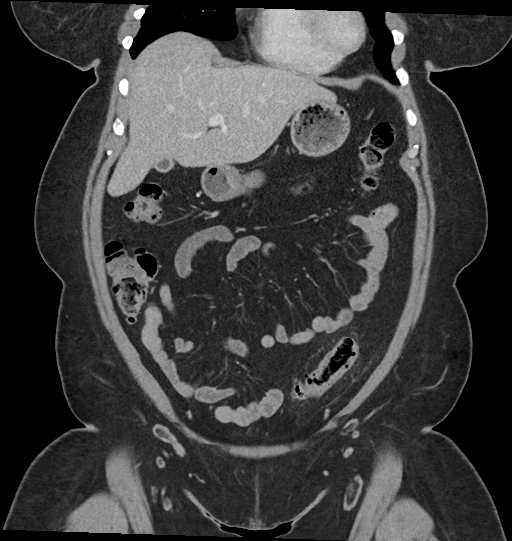
[im 47/106  soft-tissue]
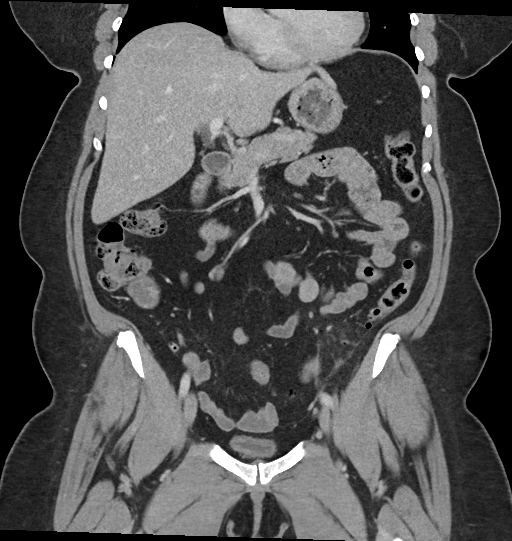
[im 59/106  soft-tissue]
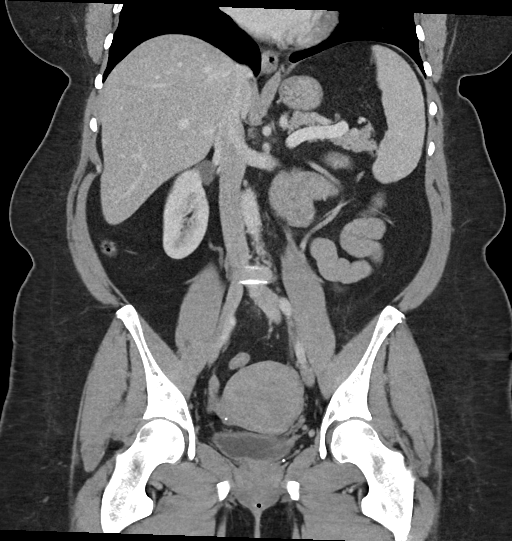

[16 of 46 positions shown; findings below may reference images not displayed]

RADIATION DOSE REDUCTION: This exam was performed according to the
departmental dose-optimization program which includes automated
exposure control, adjustment of the mA and/or kV according to
patient size and/or use of iterative reconstruction technique.

CONTRAST:  100mL OMNIPAQUE IOHEXOL 300 MG/ML  SOLN
FINDINGS: Lower chest: No acute abnormality.

Hepatobiliary: No solid liver abnormality is seen. No gallstones,
gallbladder wall thickening, or biliary dilatation.

Pancreas: Unremarkable. No pancreatic ductal dilatation or
surrounding inflammatory changes.

Spleen: Normal in size without significant abnormality.

Adrenals/Urinary Tract: Adrenal glands are unremarkable. Kidneys are
normal, without renal calculi, solid lesion, or hydronephrosis.
Bladder is unremarkable.

Stomach/Bowel: Stomach is within normal limits. Appendix appears
normal. Intermediate attenuation rounded lesion of the medial cecal
base, adjacent to the ileocecal valve, measuring 3.7 x 2.9 cm
(series 3, image 52, series 7, image 41). Descending and sigmoid
diverticulosis with relatively focal wall thickening and fat
stranding about the proximal sigmoid (series 3, image 63). No
evidence of perforation or abscess.

Vascular/Lymphatic: Aortic atherosclerosis. No enlarged abdominal or
pelvic lymph nodes.

Reproductive: Uterine fibroids.

Other: No abdominal wall hernia or abnormality. No ascites.

Musculoskeletal: No acute or significant osseous findings.
IMPRESSION: 1. Descending and sigmoid diverticulosis with relatively focal wall
thickening and fat stranding about the proximal sigmoid. Findings
are consistent with acute diverticulitis. No evidence of perforation
or abscess.
2. Incidental note of an intermediate attenuation rounded lesion of
the medial cecal base, adjacent to the ileocecal valve, measuring
3.7 x 2.9 cm. This is of uncertain composition or nature and may
reflect a fluid or stool filled diverticulum or mass. Consider
colonoscopy to further evaluate on a nonemergent, outpatient basis.
3. Uterine fibroids.

Aortic Atherosclerosis ([id number]).
